# Patient Record
Sex: Male | Born: 1940
Health system: Southern US, Community
[De-identification: ages and names within clinical notes are randomized; demographics above are authoritative.]

## PROBLEM LIST (undated history)

## (undated) DIAGNOSIS — K449 Diaphragmatic hernia without obstruction or gangrene: Secondary | ICD-10-CM

## (undated) DIAGNOSIS — K2210 Ulcer of esophagus without bleeding: Secondary | ICD-10-CM

## (undated) DIAGNOSIS — G20A1 Parkinson's disease without dyskinesia, without mention of fluctuations: Secondary | ICD-10-CM

## (undated) DIAGNOSIS — E7800 Pure hypercholesterolemia, unspecified: Secondary | ICD-10-CM

## (undated) DIAGNOSIS — I2510 Atherosclerotic heart disease of native coronary artery without angina pectoris: Secondary | ICD-10-CM

## (undated) DIAGNOSIS — K648 Other hemorrhoids: Secondary | ICD-10-CM

## (undated) DIAGNOSIS — D126 Benign neoplasm of colon, unspecified: Secondary | ICD-10-CM

## (undated) DIAGNOSIS — Z9689 Presence of other specified functional implants: Secondary | ICD-10-CM

## (undated) DIAGNOSIS — R079 Chest pain, unspecified: Secondary | ICD-10-CM

## (undated) DIAGNOSIS — E119 Type 2 diabetes mellitus without complications: Secondary | ICD-10-CM

## (undated) DIAGNOSIS — I639 Cerebral infarction, unspecified: Secondary | ICD-10-CM

## (undated) DIAGNOSIS — F419 Anxiety disorder, unspecified: Secondary | ICD-10-CM

## (undated) DIAGNOSIS — R51 Headache: Secondary | ICD-10-CM

## (undated) DIAGNOSIS — K222 Esophageal obstruction: Secondary | ICD-10-CM

## (undated) DIAGNOSIS — G20 Parkinson's disease: Secondary | ICD-10-CM

## (undated) HISTORY — DX: Ulcer of esophagus without bleeding: K22.10

## (undated) HISTORY — DX: Other hemorrhoids: K64.8

## (undated) HISTORY — PX: UPPER GASTROINTESTINAL ENDOSCOPY: SHX188

## (undated) HISTORY — PX: HAND SURGERY: SHX662

## (undated) HISTORY — PX: EYE SURGERY: SHX253

## (undated) HISTORY — DX: Esophageal obstruction: K22.2

## (undated) HISTORY — DX: Benign neoplasm of colon, unspecified: D12.6

## (undated) HISTORY — DX: Diaphragmatic hernia without obstruction or gangrene: K44.9

## (undated) HISTORY — PX: DEEP BRAIN STIMULATOR PLACEMENT: SHX608

---

## 1998-11-03 ENCOUNTER — Encounter: Payer: Self-pay | Admitting: Family Medicine

## 1998-11-03 ENCOUNTER — Ambulatory Visit: Admission: RE | Admit: 1998-11-03 | Discharge: 1998-11-03 | Payer: Self-pay | Admitting: Family Medicine

## 1999-01-19 ENCOUNTER — Encounter: Payer: Self-pay | Admitting: Cardiology

## 1999-01-19 ENCOUNTER — Ambulatory Visit (HOSPITAL_COMMUNITY): Admission: RE | Admit: 1999-01-19 | Discharge: 1999-01-19 | Payer: Self-pay | Admitting: Cardiology

## 2003-08-26 ENCOUNTER — Encounter (INDEPENDENT_AMBULATORY_CARE_PROVIDER_SITE_OTHER): Payer: Self-pay | Admitting: *Deleted

## 2003-08-26 ENCOUNTER — Ambulatory Visit (HOSPITAL_BASED_OUTPATIENT_CLINIC_OR_DEPARTMENT_OTHER): Admission: RE | Admit: 2003-08-26 | Discharge: 2003-08-26 | Payer: Self-pay | Admitting: Orthopedic Surgery

## 2003-08-26 ENCOUNTER — Ambulatory Visit (HOSPITAL_COMMUNITY): Admission: RE | Admit: 2003-08-26 | Discharge: 2003-08-26 | Payer: Self-pay | Admitting: Orthopedic Surgery

## 2008-11-18 ENCOUNTER — Encounter (INDEPENDENT_AMBULATORY_CARE_PROVIDER_SITE_OTHER): Payer: Self-pay | Admitting: *Deleted

## 2008-12-14 ENCOUNTER — Ambulatory Visit: Payer: Self-pay | Admitting: Gastroenterology

## 2008-12-20 DIAGNOSIS — D126 Benign neoplasm of colon, unspecified: Secondary | ICD-10-CM

## 2008-12-20 HISTORY — DX: Benign neoplasm of colon, unspecified: D12.6

## 2008-12-29 ENCOUNTER — Encounter: Payer: Self-pay | Admitting: Gastroenterology

## 2008-12-29 ENCOUNTER — Ambulatory Visit: Payer: Self-pay | Admitting: Gastroenterology

## 2008-12-30 ENCOUNTER — Encounter: Payer: Self-pay | Admitting: Gastroenterology

## 2010-01-26 ENCOUNTER — Encounter: Admission: RE | Admit: 2010-01-26 | Discharge: 2010-01-26 | Payer: Self-pay | Admitting: Diagnostic Neuroimaging

## 2010-02-23 ENCOUNTER — Emergency Department (HOSPITAL_BASED_OUTPATIENT_CLINIC_OR_DEPARTMENT_OTHER)
Admission: EM | Admit: 2010-02-23 | Discharge: 2010-02-23 | Payer: Self-pay | Source: Home / Self Care | Admitting: Emergency Medicine

## 2010-08-04 LAB — BASIC METABOLIC PANEL
BUN: 12 mg/dL (ref 6–23)
CO2: 29 mEq/L (ref 19–32)
Calcium: 9.5 mg/dL (ref 8.4–10.5)
Chloride: 105 mEq/L (ref 96–112)
Creatinine, Ser: 0.9 mg/dL (ref 0.4–1.5)
GFR calc Af Amer: >60 mL/min (ref >60)
GFR calc non Af Amer: >60 mL/min (ref >60)
Glucose, Bld: 143 mg/dL — ABNORMAL HIGH (ref 70–99)
Potassium: 3.9 mEq/L (ref 3.5–5.1)
Sodium: 144 mEq/L (ref 135–145)

## 2010-08-04 LAB — DIFFERENTIAL
Basophils Absolute: 0.1 10*3/uL (ref 0.0–0.1)
Lymphocytes Relative: 15 % (ref 12–46)
Lymphs Abs: 1 10*3/uL (ref 0.7–4.0)
Neutro Abs: 5.3 10*3/uL (ref 1.7–7.7)
Neutrophils Relative %: 78 % — ABNORMAL HIGH (ref 43–77)

## 2010-08-04 LAB — CBC
Platelets: 244 10*3/uL (ref 150–400)
RBC: 5.48 MIL/uL (ref 4.22–5.81)
WBC: 6.8 10*3/uL (ref 4.0–10.5)

## 2010-08-19 ENCOUNTER — Observation Stay (HOSPITAL_COMMUNITY)
Admission: EM | Admit: 2010-08-19 | Discharge: 2010-08-20 | Disposition: A | Discharge status: Home / Self Care | Payer: Medicare Other | Attending: Cardiology | Admitting: Cardiology

## 2010-08-19 ENCOUNTER — Emergency Department (HOSPITAL_COMMUNITY): Payer: Medicare Other

## 2010-08-19 DIAGNOSIS — Z79899 Other long term (current) drug therapy: Secondary | ICD-10-CM | POA: Insufficient documentation

## 2010-08-19 DIAGNOSIS — R079 Chest pain, unspecified: Principal | ICD-10-CM | POA: Insufficient documentation

## 2010-08-19 DIAGNOSIS — F411 Generalized anxiety disorder: Secondary | ICD-10-CM | POA: Insufficient documentation

## 2010-08-19 DIAGNOSIS — G20 Parkinson's disease: Secondary | ICD-10-CM | POA: Insufficient documentation

## 2010-08-19 DIAGNOSIS — G20A1 Parkinson's disease without dyskinesia, without mention of fluctuations: Secondary | ICD-10-CM | POA: Insufficient documentation

## 2010-08-19 DIAGNOSIS — E785 Hyperlipidemia, unspecified: Secondary | ICD-10-CM | POA: Insufficient documentation

## 2010-08-19 DIAGNOSIS — E119 Type 2 diabetes mellitus without complications: Secondary | ICD-10-CM | POA: Insufficient documentation

## 2010-08-19 LAB — CBC
HCT: 45.7 % (ref 39.0–52.0)
Hemoglobin: 15.7 g/dL (ref 13.0–17.0)
MCH: 30.1 pg (ref 26.0–34.0)
MCH: 30 pg (ref 26.0–34.0)
MCHC: 35 g/dL (ref 30.0–36.0)
MCV: 86 fL (ref 78.0–100.0)
MCV: 85.6 fL (ref 78.0–100.0)
Platelets: 234 10*3/uL (ref 150–400)
RBC: 5.21 MIL/uL (ref 4.22–5.81)
RDW: 12.8 % (ref 11.5–15.5)
WBC: 8.1 10*3/uL (ref 4.0–10.5)
WBC: 7.1 10*3/uL (ref 4.0–10.5)

## 2010-08-19 LAB — COMPREHENSIVE METABOLIC PANEL
Albumin: 4 g/dL (ref 3.5–5.2)
Alkaline Phosphatase: 117 U/L (ref 39–117)
BUN: 11 mg/dL (ref 6–23)
Creatinine, Ser: 0.95 mg/dL (ref 0.4–1.5)
Glucose, Bld: 132 mg/dL — ABNORMAL HIGH (ref 70–99)
Potassium: 3.7 mEq/L (ref 3.5–5.1)
Total Protein: 6.9 g/dL (ref 6.0–8.3)

## 2010-08-19 LAB — DIFFERENTIAL
Basophils Relative: 0 % (ref 0–1)
Eosinophils Absolute: 0.1 10*3/uL (ref 0.0–0.7)
Eosinophils Relative: 1 % (ref 0–5)
Lymphocytes Relative: 21 % (ref 12–46)
Lymphs Abs: 1.1 10*3/uL (ref 0.7–4.0)
Lymphs Abs: 1.5 10*3/uL (ref 0.7–4.0)
Monocytes Absolute: 0.7 10*3/uL (ref 0.1–1.0)
Monocytes Relative: 6 % (ref 3–12)
Monocytes Relative: 10 % (ref 3–12)
Neutro Abs: 6.5 10*3/uL (ref 1.7–7.7)
Neutrophils Relative %: 80 % — ABNORMAL HIGH (ref 43–77)

## 2010-08-19 LAB — BASIC METABOLIC PANEL
BUN: 12 mg/dL (ref 6–23)
Chloride: 105 mEq/L (ref 96–112)
Creatinine, Ser: 0.99 mg/dL (ref 0.4–1.5)
GFR calc Af Amer: >60 mL/min (ref >60)
GFR calc non Af Amer: >60 mL/min (ref >60)

## 2010-08-19 LAB — URINALYSIS, ROUTINE W REFLEX MICROSCOPIC
Glucose, UA: NEGATIVE mg/dL
Ketones, ur: NEGATIVE mg/dL
Nitrite: NEGATIVE
Protein, ur: NEGATIVE mg/dL
Urobilinogen, UA: 0.2 mg/dL (ref 0.0–1.0)

## 2010-08-19 LAB — POCT CARDIAC MARKERS
CKMB, poc: <1 ng/mL — ABNORMAL LOW (ref 1.0–8.0)
Troponin i, poc: <0.05 ng/mL (ref 0.00–0.09)
Troponin i, poc: <0.05 ng/mL (ref 0.00–0.09)

## 2010-08-19 LAB — PROTIME-INR
INR: 1.05 (ref 0.00–1.49)
Prothrombin Time: 13.9 seconds (ref 11.6–15.2)

## 2010-08-19 LAB — APTT: aPTT: 28 seconds (ref 24–37)

## 2010-08-19 LAB — CARDIAC PANEL(CRET KIN+CKTOT+MB+TROPI): Troponin I: 0.01 ng/mL (ref 0.00–0.06)

## 2010-08-19 LAB — HEMOGLOBIN A1C
Hgb A1c MFr Bld: 7.5 % — ABNORMAL HIGH (ref <5.7)
Mean Plasma Glucose: 169 mg/dL — ABNORMAL HIGH (ref <117)

## 2010-08-20 LAB — CARDIAC PANEL(CRET KIN+CKTOT+MB+TROPI)
CK, MB: 2.5 ng/mL (ref 0.3–4.0)
Total CK: 165 U/L (ref 7–232)
Troponin I: 0.01 ng/mL (ref 0.00–0.06)

## 2010-08-20 LAB — GLUCOSE, CAPILLARY: Glucose-Capillary: 202 mg/dL — ABNORMAL HIGH (ref 70–99)

## 2010-08-20 LAB — LIPID PANEL
HDL: 31 mg/dL — ABNORMAL LOW (ref >39)
Triglycerides: 158 mg/dL — ABNORMAL HIGH (ref <150)
VLDL: 32 mg/dL (ref 0–40)

## 2010-08-22 NOTE — Discharge Summary (Signed)
  NAME:  Kirk Rosario, Kirk Rosario NO.:  [id number]  MEDICAL RECORD NO.:  [id number]           PATIENT TYPE:  O  LOCATION:  1440                         FACILITY:  [facility]  PHYSICIAN:  Landry Corporal, MD DATE OF BIRTH:  February 22, 1941  DATE OF ADMISSION:  08/19/2010 DATE OF DISCHARGE:  08/20/2010                              DISCHARGE SUMMARY   DISCHARGE DIAGNOSES: 1. Chest pain, myocardial infarction, ruled out. 2. History of 2 previous normal catheterizations 10 years ago. 3. Type 2 non-insulin-dependent diabetes. 4. Parkinson disease. 5. Treated dyslipidemia. 6. Anxiety disorder.  HOSPITAL COURSE:  The patient is a 70 year old male who had seen Dr. Clarene Duke remotely.  He had had 2 previous catheterizations in the 1990s and early 2000s.  These were reportedly negative.  He has not seen Dr. Clarene Duke for years.  He presented emergency room at [facility] on August 19, 2010, after having some epigastric pain associated with sharp shooting pain up his left arm.  This came on after he was moaning in his yard.  His EKG and troponins were normal.  He was admitted for overnight observation.  His enzymes continued to be normal and Dr. Herbie Baltimore feels he can be discharged with plan for an outpatient Myoview this week.  We did add PPI, low-dose beta-blocker, and p.r.n. nitroglycerin.  LABORATORY DATA:  Stool for occult blood was negative.  Cholesterol is 129, HDL 31, LDL 66.  CK-MB and troponins were negative x4.  Sodium 139, potassium 3.7, BUN 11, creatinine 0.95.  Liver functions were normal. Hemoglobin A1c is 7.5, mean plasma glucose 169.  TSH 1.66.  Urinalysis is unremarkable.  White count 7.1, hemoglobin 16, hematocrit 45.7, platelets 234.  IMAGING:  Chest x-ray shows hyperexpansion and bibasilar atelectasis without acute findings.  EKG shows sinus rhythm without acute changes.  DISPOSITION:  The patient is discharged in stable condition.  He will follow up with Dr. Herbie Baltimore as  an outpatient.  He will be set up for a Persantine Myoview in our office this coming week.     Abelino Derrick, P.A.   ______________________________ Landry Corporal, MD    LKK/MEDQ  D:  08/20/2010  T:  08/20/2010  Job:  [id number]  cc:   Landry Corporal, MD Fax: [phone]  Anna Genre. Little, M.D. Fax: [phone]  Electronically Signed by Corine Shelter P.A. on 08/22/2010 11:39:54 AM Electronically Signed by Bryan Lemma MD on 08/22/2010 10:53:07 PM

## 2010-08-22 NOTE — H&P (Signed)
NAME:  Kirk Rosario, FAHR NO.:  [id number]  MEDICAL RECORD NO.:  [id number]           PATIENT TYPE:  O  LOCATION:  1440                         FACILITY:  [facility]  PHYSICIAN:  Landry Corporal, MD DATE OF BIRTH:  07-09-1940  DATE OF ADMISSION:  08/19/2010 DATE OF DISCHARGE:                             HISTORY & PHYSICAL   CHIEF COMPLAINT:  Chest pain.  HISTORY OF PRESENT ILLNESS:  Kirk Rosario is a pleasant 70 year old male who have been seen by Dr. Clarene Duke remotely.  He apparently had catheterization in the 1990s and in early 2000, both of which were negative for significant coronary artery disease per the patient's history.  These records are not available to me.  He has not seen Dr. Clarene Duke since then.  He has not had other cardiac issues.  He does have Parkinson's, and is followed by a neurologist at [facility].  The patient is admitted to the emergency room at [facility] today.  He went to mow his grass today and said he rested about halfway through.  He then developed some epigastric discomfort and a sharp pain that shot up his left arm.  He decided to come to the emergency room for further evaluation.  His initial troponin is negative.  His EKG is normal.  He tells me that when he walks exerts himself, he does not have chest pain or unusual shortness of breath.  Interestingly, he does say he gets some chest discomfort and shortness of breath after taking ropinirole which he takes 3 times a day for Parkinson's.  He says his neurologist knows about this and apparently is not concerned.  This has been going on since December.  PAST MEDICAL HISTORY:  Remarkable for: 1. Parkinson's. 2. Type 2 noninsulin-dependent diabetes. 3. Dyslipidemia. 4. Prior Dupuytren contracture surgery in 2005.  HOME MEDICATIONS: 1. Metformin 500 mg daily. 2. Pravastatin 20 mg a day. 3. Paxil 20 mg a day. 4. Ropinirole 3 mg t.i.d. 5. Ativan 1 mg q.8 p.r.n.  He has no known drug  allergies.  SOCIAL HISTORY:  He is a retired Engineer, [employer].  He is a nonsmoker, nondrinker.  He is married, he has three daughters.  FAMILY HISTORY:  Unremarkable for coronary artery disease.  REVIEW OF SYSTEMS:  He has had a remote hiatal hernia.  He does wear glasses.  Review of systems otherwise unremarkable except for noted above.  PHYSICAL EXAMINATION:  VITAL SIGNS:  Blood pressure in the emergency room 139/58, pulse 73, temperature 98.1. GENERAL:  He is a well-developed, well-nourished male in no acute distress. HEENT:  Normocephalic.  Extraocular movements are intact.  Sclerae are nonicteric.  Conjunctivae within normal limits. NECK:  Without bruits or JVD. CHEST:  Clear to auscultation and percussion. CARDIAC:  Regular rate and rhythm without murmur, rub, or gallop. Normal S1 and S2. ABDOMEN:  Nontender.  No hepatosplenomegaly.  Bowel sounds present. EXTREMITIES:  Without edema.  Distal pulses are 3+/4. NEUROLOGIC:  Grossly intact.  He does have Parkinsonian movements of his arms and legs.  SKIN:  Cool and dry.  LABORATORY DATA:  Sodium 138, potassium 4.2, chloride 105, CO2  of 27, BUN 12, creatinine 0.99.  White count 8.1, hemoglobin 15.7, hematocrit 44.8, platelets 224.  Urinalysis unremarkable.  Troponin is negative x1. EKG shows sinus rhythm without acute changes.  Chest x-ray shows hyperexpansion and some bibasilar atelectasis without acute findings.  IMPRESSION: 1. Chest pain, rule out cardiac. 2. Prior normal coronaries in the 1990s and early 2000 by the     patient's history. 3. Type 2 noninsulin-dependent diabetes. 4. Dyslipidemia. 5. Parkinson's, followed at [facility].  PLAN:  The patient will be admitted to telemetry for 24-hour observation.  I will go ahead and put him on Lovenox, aspirin, low-dose metoprolol, and rule him out for an MI.  If he rules out, he will be set up for an outpatient Myoview in the office.  The patient was seen by Dr. Herbie Baltimore and  myself.     Abelino Derrick, P.A.   ______________________________ Landry Corporal, MD    LKK/MEDQ  D:  08/19/2010  T:  08/20/2010  Job:  [id number]  cc:   Caryn Bee L. Little, M.D. Thereasa Solo. Little, M.D.  Electronically Signed by Corine Shelter P.A. on 08/22/2010 11:39:48 AM Electronically Signed by Bryan Lemma MD on 08/22/2010 10:52:36 PM MedRecNo: [id number] [facility], Account: [id number], DocSeq: [id number] I saw and examined the patient in the [facility] ER along with Mr. Kirk Rosario after reviewing the chart.  I agree with his findings, exam along with his impression and plan.  See paper chart for my handwritten attestation. Electronically Signed by Bryan Lemma MD on 08/22/2010 10:52:16 PM

## 2010-10-07 NOTE — Op Note (Signed)
NAME:  Kirk Rosario, Kirk Rosario                        ACCOUNT NO.:  [id number]   MEDICAL RECORD NO.:  [id number]                   PATIENT TYPE:  AMB   LOCATION:  DSC                                  FACILITY:  [facility]   PHYSICIAN:  Artist Pais. Mina Marble, M.D.           DATE OF BIRTH:  22-Mar-1941   DATE OF PROCEDURE:  DATE OF DISCHARGE:                                 OPERATIVE REPORT   PREOPERATIVE DIAGNOSIS:  Left hand fourth and fifth digit Dupuytren's  contracture.   POSTOPERATIVE DIAGNOSIS:  Left hand fourth and fifth digit Dupuytren's  contracture.   PROCEDURE:  Release of Dupuytren's contracture, palmar aspect of left hand,  fourth and fifth digits.   SURGEON:  Artist Pais. Mina Marble, M.D.   ASSISTANT:  __________   ANESTHESIA:  General.   TOURNIQUET TIME:  One hour.   COMPLICATIONS:  None.   DRAINS:  None.   OPERATIVE REPORT:  Patient was taken to the operating room.  With the  induction of general anesthesia, the left upper extremity was prepped and  draped in the usual, sterile fashion.  An Esmarch was used to exsanguinate  the limb.  Tourniquet was inflated to 275 mmHg.  At this point in time, a  Brunner-type  incision was made over the ulnar side of the hand going up to  the level of the DIP flexion crease on the little finger.  The flaps were  carefully raised accordingly.  The neurovascular bundles were identified in  the proximal aspect of the wound.  A large ulnar-based cord was identified.  The ulnar neurovascular bundle was taken to the midline at the level of the  metacarpophalangeal joint.  Neurovascular bundles were carefully dissected  out and retracted.  Once this was done, the remaining aspect of the  contracture was excised to the level of the PIP joint.  Once this was done,  the MP and PIP joints were gently manipulated and a small amount of  contracted tissue in the PIP joint was released with a #15 blade in a  __________ fashion.  Once this was done, a  second incision was made parallel  in the first incision in the palmar aspect overlying the fourth metacarpal.  Incision was taken down through the skin and subcutaneous tissues, flaps  raised accordingly, and diseased cord was removed also.  MP joint was gently  manipulated.  Both wounds were thoroughly irrigated.  Hemostasis was  achieved with bipolar cautery and they were closed with 5-0 nylon.  A #5  pediatric feeding tube was placed in the wounds.  Aristospan was placed in  the wound for postoperative pain and swelling control.  The patient, after  the wound closure of 5-0 nylon, was then placed in sterile dressing with  Xeroform, 4 x 4's, fluffs, and a volar splint with the fingers in maximum  extension.  Patient tolerated the procedure well and went to recovery room  in stable condition.  Artist Pais Mina Marble, M.D.   MAW/MEDQ  D:  08/26/2003  T:  08/26/2003  Job:  [id number]

## 2011-05-02 ENCOUNTER — Other Ambulatory Visit: Payer: Self-pay

## 2011-05-02 ENCOUNTER — Emergency Department (HOSPITAL_COMMUNITY): Payer: Medicare Other

## 2011-05-02 ENCOUNTER — Encounter: Payer: Self-pay | Admitting: *Deleted

## 2011-05-02 ENCOUNTER — Emergency Department (HOSPITAL_COMMUNITY)
Admission: EM | Admit: 2011-05-02 | Discharge: 2011-05-02 | Disposition: A | Discharge status: Home / Self Care | Payer: Medicare Other | Attending: Emergency Medicine | Admitting: Emergency Medicine

## 2011-05-02 DIAGNOSIS — R0602 Shortness of breath: Secondary | ICD-10-CM | POA: Insufficient documentation

## 2011-05-02 DIAGNOSIS — Z79899 Other long term (current) drug therapy: Secondary | ICD-10-CM | POA: Insufficient documentation

## 2011-05-02 DIAGNOSIS — G20A1 Parkinson's disease without dyskinesia, without mention of fluctuations: Secondary | ICD-10-CM | POA: Insufficient documentation

## 2011-05-02 DIAGNOSIS — I10 Essential (primary) hypertension: Secondary | ICD-10-CM | POA: Insufficient documentation

## 2011-05-02 DIAGNOSIS — R079 Chest pain, unspecified: Secondary | ICD-10-CM | POA: Insufficient documentation

## 2011-05-02 DIAGNOSIS — R42 Dizziness and giddiness: Secondary | ICD-10-CM | POA: Insufficient documentation

## 2011-05-02 DIAGNOSIS — E119 Type 2 diabetes mellitus without complications: Secondary | ICD-10-CM | POA: Insufficient documentation

## 2011-05-02 DIAGNOSIS — G20 Parkinson's disease: Secondary | ICD-10-CM | POA: Insufficient documentation

## 2011-05-02 DIAGNOSIS — M79609 Pain in unspecified limb: Secondary | ICD-10-CM | POA: Insufficient documentation

## 2011-05-02 HISTORY — DX: Parkinson's disease without dyskinesia, without mention of fluctuations: G20.A1

## 2011-05-02 HISTORY — DX: Pure hypercholesterolemia, unspecified: E78.00

## 2011-05-02 HISTORY — DX: Parkinson's disease: G20

## 2011-05-02 LAB — POCT I-STAT, CHEM 8
Creatinine, Ser: 1 mg/dL (ref 0.50–1.35)
HCT: 49 % (ref 39.0–52.0)
Hemoglobin: 16.7 g/dL (ref 13.0–17.0)
Potassium: 4.1 mEq/L (ref 3.5–5.1)
Sodium: 141 mEq/L (ref 135–145)
TCO2: 24 mmol/L (ref 0–100)

## 2011-05-02 LAB — DIFFERENTIAL
Basophils Absolute: 0 10*3/uL (ref 0.0–0.1)
Basophils Relative: 0 % (ref 0–1)
Eosinophils Absolute: 0.2 10*3/uL (ref 0.0–0.7)
Monocytes Relative: 6 % (ref 3–12)
Neutro Abs: 5.1 10*3/uL (ref 1.7–7.7)
Neutrophils Relative %: 71 % (ref 43–77)

## 2011-05-02 LAB — TROPONIN I: Troponin I: <0.3 ng/mL (ref <0.30)

## 2011-05-02 LAB — CBC
MCH: 30.3 pg (ref 26.0–34.0)
MCHC: 35.9 g/dL (ref 30.0–36.0)
Platelets: 207 10*3/uL (ref 150–400)
RDW: 12.5 % (ref 11.5–15.5)

## 2011-05-02 NOTE — ED Notes (Signed)
Pt c/o L arm pain and lightheadedness this am. Reports mid chest pain and L arm tingling last night. Denies shob, n/v. Hx of parkinsons.

## 2011-05-02 NOTE — ED Notes (Signed)
Patient is very anxious will inform Dr Rubin Payor

## 2011-05-02 NOTE — ED Notes (Signed)
States that he had CP all day yesterday. States that he took an antiacid and the pain went away. States that he remains light headed this am. States that he has been monitoring his BP at home and has not been dx with HTN

## 2011-05-02 NOTE — ED Notes (Signed)
No complaints at present. Voices understanding of instructions given. Walked to check out window.  

## 2011-05-02 NOTE — ED Notes (Signed)
Returned from [employer] much calmer.

## 2011-05-02 NOTE — ED Provider Notes (Signed)
History     CSN: [id number] Arrival date & time: 05/02/2011  8:39 AM   First MD Initiated Contact with Patient 05/02/11 [phone]      Chief Complaint  Patient presents with  . Arm Pain  . Dizziness    (Consider location/radiation/quality/duration/timing/severity/associated sxs/prior treatment) Patient is a 70 y.o. male presenting with arm pain. The history is provided by the patient.  Arm Pain This is a recurrent problem. Associated symptoms include chest pain. Pertinent negatives include no abdominal pain, no headaches and no shortness of breath.   patient developed some left arm tingling this morning. He's also had some sharp chest pain last night. He states that was relieved with his antacid. He no cough. No fevers. He also states that he had some lightheadedness this morning. He's also felt fatigued. He states it makes it is just his Parkinson's. He states he still has a little bit of the tingling now. He states the chest pain is resolved.   Past Medical History  Diagnosis Date  . Diabetes mellitus   . Hypertension   . Parkinson disease   . Hypercholesteremia     Past Surgical History  Procedure Date  . Hand surgery     No family history on file.  History  Substance Use Topics  . Smoking status: Never Smoker   . Smokeless tobacco: Not on file  . Alcohol Use: No      Review of Systems  Constitutional: Negative for activity change and appetite change.  HENT: Negative for neck stiffness.   Eyes: Negative for pain.  Respiratory: Negative for chest tightness and shortness of breath.   Cardiovascular: Positive for chest pain. Negative for leg swelling.  Gastrointestinal: Negative for nausea, vomiting, abdominal pain and diarrhea.  Genitourinary: Negative for flank pain.  Musculoskeletal: Negative for back pain.  Skin: Negative for rash.  Neurological: Positive for light-headedness. Negative for weakness, numbness and headaches.  Psychiatric/Behavioral: Negative for  behavioral problems.    Allergies  Review of patient's allergies indicates no known allergies.  Home Medications   Current Outpatient Rx  Name Route Sig Dispense Refill  . ASPIRIN EC 325 MG PO TBEC Oral Take 325 mg by mouth daily as needed. For pain     . CARBIDOPA-LEVODOPA 25-100 MG PO TABS Oral Take 1 tablet by mouth 3 (three) times daily.      Marland Kitchen METFORMIN HCL 500 MG PO TABS Oral Take 500 mg by mouth daily.      Marland Kitchen PAROXETINE HCL 20 MG PO TABS Oral Take 20 mg by mouth daily.      Marland Kitchen PRAVASTATIN SODIUM 40 MG PO TABS Oral Take 40 mg by mouth every evening.      Marland Kitchen ROPINIROLE HCL 0.5 MG PO TABS Oral Take 0.5 mg by mouth 3 (three) times daily.      . TRIHEXYPHENIDYL HCL 2 MG PO TABS Oral Take 2 mg by mouth 2 (two) times daily.        BP 157/65  Pulse 66  Temp(Src) 97.7 F (36.5 C) (Oral)  Resp 16  SpO2 100%  Physical Exam  Nursing note and vitals reviewed. Constitutional: He is oriented to person, place, and time. He appears well-developed and well-nourished.  HENT:  Head: Normocephalic and atraumatic.  Eyes: EOM are normal. Pupils are equal, round, and reactive to light.  Neck: Normal range of motion. Neck supple.  Cardiovascular: Normal rate, regular rhythm and normal heart sounds.   No murmur heard. Pulmonary/Chest: Effort normal and breath sounds  normal.  Abdominal: Soft. Bowel sounds are normal. He exhibits no distension and no mass. There is no tenderness. There is no rebound and no guarding.  Musculoskeletal: Normal range of motion. He exhibits no edema.  Neurological: He is alert and oriented to person, place, and time. No cranial nerve deficit.       Patient has a tremor  Skin: Skin is warm and dry.  Psychiatric: He has a normal mood and affect.    ED Course  Procedures (including critical care time)  Labs Reviewed  POCT I-STAT, CHEM 8 - Abnormal; Notable for the following:    Glucose, Bld 168 (*)    All other components within normal limits  CBC  DIFFERENTIAL    TROPONIN I  I-STAT, CHEM 8   Dg Chest 2 View  05/02/2011  *RADIOLOGY REPORT*  Clinical Data: Chest pain and shortness of breath.  Nonsmoker.  CHEST - 2 VIEW  Comparison: 08/19/2010  Findings: Thoracic spondylosis/diffuse idiopathic skeletal hyperostosis.  Costophrenic angles minimally excluded from the lateral. Midline trachea.  Normal heart size and mediastinal contours for age.  No pleural effusion or pneumothorax.  Right upper lobe calcified granuloma versus soft tissue calcification. Lungs otherwise clear.  IMPRESSION: No acute cardiopulmonary disease.  Original Report Authenticated By: Consuello Bossier, M.D.     1. Chest pain      Date: 05/02/2011  Rate: 66  Rhythm: normal sinus rhythm  QRS Axis: normal  Intervals: normal  ST/T Wave abnormalities: normal  Conduction Disutrbances:none  Narrative Interpretation:   Old EKG Reviewed: unchanged    MDM  Patient presents with chest pain and tingling in his left arm. He thinks it may be related to his Parkinson's medicine. He is previously had 2 negative caths and recently a negative stress test. His EKG is stable enzymes are negative. His laboratory x-ray is reassuring. He'll be discharged home. He'll followup with his cardiologist.        Juliet Rude. Rubin Payor, MD 05/02/11 1037

## 2011-06-07 ENCOUNTER — Other Ambulatory Visit: Payer: Self-pay

## 2011-06-07 ENCOUNTER — Encounter (HOSPITAL_COMMUNITY): Payer: Self-pay

## 2011-06-07 ENCOUNTER — Emergency Department (HOSPITAL_COMMUNITY): Payer: Medicare Other

## 2011-06-07 ENCOUNTER — Emergency Department (HOSPITAL_COMMUNITY)
Admission: EM | Admit: 2011-06-07 | Discharge: 2011-06-07 | Disposition: A | Discharge status: Home / Self Care | Payer: Medicare Other | Attending: Cardiology | Admitting: Cardiology

## 2011-06-07 DIAGNOSIS — G030 Nonpyogenic meningitis: Secondary | ICD-10-CM | POA: Insufficient documentation

## 2011-06-07 DIAGNOSIS — R079 Chest pain, unspecified: Secondary | ICD-10-CM

## 2011-06-07 DIAGNOSIS — K449 Diaphragmatic hernia without obstruction or gangrene: Secondary | ICD-10-CM | POA: Insufficient documentation

## 2011-06-07 DIAGNOSIS — Z7982 Long term (current) use of aspirin: Secondary | ICD-10-CM | POA: Insufficient documentation

## 2011-06-07 DIAGNOSIS — Z79899 Other long term (current) drug therapy: Secondary | ICD-10-CM | POA: Insufficient documentation

## 2011-06-07 DIAGNOSIS — E119 Type 2 diabetes mellitus without complications: Secondary | ICD-10-CM

## 2011-06-07 HISTORY — DX: Chest pain, unspecified: R07.9

## 2011-06-07 HISTORY — DX: Diaphragmatic hernia without obstruction or gangrene: K44.9

## 2011-06-07 HISTORY — DX: Type 2 diabetes mellitus without complications: E11.9

## 2011-06-07 LAB — CBC
Hemoglobin: 16.3 g/dL (ref 13.0–17.0)
MCH: 29.8 pg (ref 26.0–34.0)
Platelets: 218 10*3/uL (ref 150–400)
RBC: 5.47 MIL/uL (ref 4.22–5.81)

## 2011-06-07 LAB — TROPONIN I
Troponin I: <0.3 ng/mL (ref <0.30)
Troponin I: <0.3 ng/mL (ref <0.30)

## 2011-06-07 LAB — BASIC METABOLIC PANEL
CO2: 27 mEq/L (ref 19–32)
Calcium: 9.6 mg/dL (ref 8.4–10.5)
GFR calc non Af Amer: 73 mL/min — ABNORMAL LOW (ref >90)
Potassium: 4.1 mEq/L (ref 3.5–5.1)
Sodium: 138 mEq/L (ref 135–145)

## 2011-06-07 LAB — D-DIMER, QUANTITATIVE: D-Dimer, Quant: <0.22 ug/mL-FEU (ref 0.00–0.48)

## 2011-06-07 NOTE — Consult Note (Signed)
Reason for Consult: chest pain Primary physician: Dr. Catha Gosselin Cardiologist: Dr. Bryan Lemma  Referring Physician: ER physician   Kirk Rosario is an 71 y.o. male.    Chief Complaint: chest pain that began today   HPI: 71 year old white married male presented to the emergency room after developing chest pain approximately 11 AM today while watching TV. He describes it as sharp pain which began in the diaphragmatic area and radiated upward into his chest where it localized in the significant pain rated 12/10. The pain would come and go. No associated symptoms of nausea vomiting, shortness of breath or diaphoresis but the patient's wife states he was very pale. He does not usually complain of any problems so this was unusual for him. This was his second or third episode of this type of chest pain he was seen here in December with similar pain.  This pain was similar to that in the past when a cardiac cath was nl and stress test was nl.  Patient has had a cardiac catheterization in 1995 and again in 1999 with patent coronary arteries. He was seen by Dr. Herbie Baltimore and underwent a stress Myoview in April of 2012 which was negative for ischemia. EF was 64%.  Patient's other history as stated below as well as anxiety.  Currently on our exam the patient is totally pain-free. Dr. Tresa Endo has seen him and examined him and does not believe this to be of cardiac origin. His  troponin I x2 are negative.    From a cardiology perspective the patient may be discharged home and followup in our office 06/15/2011 at 10:15 AM  Past Medical History  Diagnosis Date  . Diabetes mellitus   . Parkinson disease   . Hypercholesteremia   . Chest pain at rest 06/07/2011  . DM (diabetes mellitus) 06/07/2011  . Hernia, hiatal     Past Surgical History  Procedure Date  . Hand surgery     Family History  Problem Relation Age of Onset  . Heart failure Mother   . Cancer Father    Social History:  reports  that he quit smoking about 45 years ago. He has never used smokeless tobacco. He reports that he does not drink alcohol or use illicit drugs. 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  Allergies: No Known Allergies  No current facility-administered medications on file as of 06/07/2011.   Medications Prior to Admission  Medication Sig Dispense Refill  . aspirin EC 325 MG tablet Take 325 mg by mouth daily as needed. For pain       . carbidopa-levodopa (SINEMET) 25-100 MG per tablet Take 1 tablet by mouth 3 (three) times daily.        . metFORMIN (GLUCOPHAGE) 500 MG tablet Take 500 mg by mouth daily.        Marland Kitchen PARoxetine (PAXIL) 20 MG tablet Take 20 mg by mouth daily.        . pravastatin (PRAVACHOL) 40 MG tablet Take 40 mg by mouth every evening.        Marland Kitchen rOPINIRole (REQUIP) 0.5 MG tablet Take 0.25 mg by mouth 3 (three) times daily.       . trihexyphenidyl (ARTANE) 2 MG tablet Take 2 mg by mouth 2 (two) times daily.          Results for orders placed during the hospital encounter of 06/07/11 (from the past 48 hour(s))  CBC     Status: Normal  Collection Time   06/07/11  1:10 PM      Component Value Range Comment   WBC 6.8  4.0 - 10.5 (K/uL)    RBC 5.47  4.22 - 5.81 (MIL/uL)    Hemoglobin 16.3  13.0 - 17.0 (g/dL)    HCT 40.9  81.1 - 91.4 (%)    MCV 83.7  78.0 - 100.0 (fL)    MCH 29.8  26.0 - 34.0 (pg)    MCHC 35.6  30.0 - 36.0 (g/dL)    RDW 78.2  95.6 - 21.3 (%)    Platelets 218  150 - 400 (K/uL)   BASIC METABOLIC PANEL     Status: Abnormal   Collection Time   06/07/11  1:10 PM      Component Value Range Comment   Sodium 138  135 - 145 (mEq/L)    Potassium 4.1  3.5 - 5.1 (mEq/L)    Chloride 99  96 - 112 (mEq/L)    CO2 27  19 - 32 (mEq/L)    Glucose, Bld 258 (*) 70 - 99 (mg/dL)    BUN 12  6 - 23 (mg/dL)    Creatinine, Ser 0.86  0.50 - 1.35 (mg/dL)    Calcium 9.6  8.4 - 10.5 (mg/dL)    GFR calc non Af Amer 73 (*) >90 (mL/min)    GFR calc Af Amer 84 (*)  >90 (mL/min)   TROPONIN I     Status: Normal   Collection Time   06/07/11  1:10 PM      Component Value Range Comment   Troponin I <0.30  <0.30 (ng/mL)    Dg Chest 2 View  06/07/2011  *RADIOLOGY REPORT*  Clinical Data: Mid chest pain, history diabetes  CHEST - 2 VIEW  Comparison: 05/02/2011  Findings: Normal heart size, mediastinal contours, and pulmonary vascularity. Probable bilateral nipple shadows, unchanged since 08/19/2010. Radiopaque foreign body projects over right upper lobe. Lungs clear. No pleural effusion or pneumothorax. Bones unremarkable. Minimal linear scarring left base stable.  IMPRESSION: No acute abnormalities.  Original Report Authenticated By: Lollie Marrow, M.D.    ROSDicky Doe.: No colds or fevers. Skin: No rashes or ulcers. HEENT: No blurred vision or double vision. Cardiovascular: No awareness of palpitations. No chest pain with activity though he walks 5 miles a day and rides his bike 18 miles a day and that is a stationary bicycle.  Pulmonary: Denies shortness of breath or wheezing. GI: States he does have a hiatal hernia this pain is similar to the pain he had when he had his heart catheterization. No diarrhea constipation or melena. GU: No hematuria or dysuria. Musculoskeletal: no claudication symptoms no arthritic pains. Endocrine: positive for diabetes  And treated.  But no thyroid disease. Neuro: On felt as if he would pass out today the pain was so severe. Patient has a history of Parkinson's with tremors most significantly in his right leg treated at [facility]   Blood pressure 157/69, pulse 74, temperature 98.1 F (36.7 C), temperature source Oral, resp. rate 19, SpO2 100.00%. PE: Gen.: Alert, oriented, white male, no acute distress, pleasant affect.  Skin: Warm and dry brisk artery refill. HEENT: Normocephalic, sclera clear. Neck: Supple no JVD no bruits no thyromegaly. Lungs: Clear without rales rhonchi or wheezes Heart: S1-S2 regular rate and  rhythm without murmur gallop rub or click. Abdomen: Soft nontender positive bowel sounds to palpate liver spleen or masses.  Small ventral hernia vs diastasis recti in mid epigastric region. Extremities  no lower extremity edema 2+ pedal pulses bilaterally. Tremor of the right lower Sterman in on exam. Neuro: Alert and oriented x3 moves all extremities follows commands.  Assessment/Plan Patient Active Problem List  Diagnoses  . Chest pain at rest , negative troponins, patent coronary arteries 1999 and neg. stress myoview 08/2010  . DM (diabetes mellitus)   PLAN:data telemetry has seen and examined the patient. The patient has no further complaints no chest pain he did take aspirin prior to his arrival.  On the monitor he does have an occasional PVC and PAC. His EKG was without acute changes from prior EKGs.  Dr. Landry Dyke discussed that he does not believe this is cardiac in origin and that he should followup with his primary care physician, Dr. Catha Gosselin for GI evaluation and we will also have him see Dr. Herbie Baltimore for followup.  INGOLD,LAURA R 06/07/2011, 4:32 PM    Patient seen and examined. Agree with assessment and plan. Pt is now currently pain free.  He ha undergone 2 prior cardiac catheterizations which were nl.  Last year after experiencing similar chest pain, a nuclear scan was nl.  He denies exertional precipitation, dypnea or reflux symptoms.  Initial cardiac markers are negative.  Will check d-dimer and if negative plan dc today with f/u with PCP and Dr. Herbie Baltimore.  Lennette Bihari, MD, [facility] 06/07/2011 4:53 PM

## 2011-06-07 NOTE — ED Provider Notes (Signed)
History     CSN: [id number]  Arrival date & time 06/07/11  1245   First MD Initiated Contact with Patient 06/07/11 1259      No chief complaint on file.   (Consider location/radiation/quality/duration/timing/severity/associated sxs/prior treatment) HPI  Cardiologist is Dr. Herbie Baltimore with [facility]. Pt was watching jeopardy this morning around 11:30 am when he developed severe left sided chest pain with presyncope. Pt did not LOC. He states that it was a sharp pain and lasted a couple of hours. He took an aspirin and he and his wife came to the ED. Since arriving to the ED he has been pain free. He denies a hx of GERD, N/V/D abdominal pain, syncope, recent illness, coughing, chills, fever. Last cath was in 2000.  Past Medical History  Diagnosis Date  . Diabetes mellitus   . Parkinson disease   . Hypercholesteremia   . Chest pain at rest 06/07/2011  . DM (diabetes mellitus) 06/07/2011  . Hernia, hiatal     Past Surgical History  Procedure Date  . Hand surgery     Family History  Problem Relation Age of Onset  . Heart failure Mother   . Cancer Father     History  Substance Use Topics  . Smoking status: Former Smoker    Quit date: 05/22/1966  . Smokeless tobacco: Never Used  . Alcohol Use: No      Review of Systems  All other systems reviewed and are negative.    Allergies  Review of patient's allergies indicates no known allergies.  Home Medications   Current Outpatient Rx  Name Route Sig Dispense Refill  . ASPIRIN EC 325 MG PO TBEC Oral Take 325 mg by mouth daily as needed. For pain     . CARBIDOPA-LEVODOPA 25-100 MG PO TABS Oral Take 1 tablet by mouth 3 (three) times daily.      Marland Kitchen METFORMIN HCL 500 MG PO TABS Oral Take 500 mg by mouth daily.      Marland Kitchen PAROXETINE HCL 20 MG PO TABS Oral Take 20 mg by mouth daily.      Marland Kitchen PRAVASTATIN SODIUM 40 MG PO TABS Oral Take 40 mg by mouth every evening.      Marland Kitchen ROPINIROLE HCL 0.5 MG PO TABS Oral Take 0.25 mg by mouth 3 (three)  times daily.     . TRIHEXYPHENIDYL HCL 2 MG PO TABS Oral Take 2 mg by mouth 2 (two) times daily.        BP 157/69  Pulse 74  Temp(Src) 98.1 F (36.7 C) (Oral)  Resp 19  SpO2 100%  Physical Exam  Nursing note and vitals reviewed. Constitutional: He is oriented to person, place, and time. He appears well-developed and well-nourished.  HENT:  Head: Normocephalic and atraumatic.  Eyes: EOM are normal. Pupils are equal, round, and reactive to light.  Neck: Normal range of motion.  Cardiovascular: Normal rate and regular rhythm.   Pulmonary/Chest: Effort normal and breath sounds normal.  Musculoskeletal: Normal range of motion.  Neurological: He is alert and oriented to person, place, and time.  Skin: Skin is warm and dry.    ED Course  Procedures (including critical care time)  Labs Reviewed  BASIC METABOLIC PANEL - Abnormal; Notable for the following:    Glucose, Bld 258 (*)    GFR calc non Af Amer 73 (*)    GFR calc Af Amer 84 (*)    All other components within normal limits  CBC  TROPONIN I  TROPONIN  I  D-DIMER, QUANTITATIVE   Dg Chest 2 View  06/07/2011  *RADIOLOGY REPORT*  Clinical Data: Mid chest pain, history diabetes  CHEST - 2 VIEW  Comparison: 05/02/2011  Findings: Normal heart size, mediastinal contours, and pulmonary vascularity. Probable bilateral nipple shadows, unchanged since 08/19/2010. Radiopaque foreign body projects over right upper lobe. Lungs clear. No pleural effusion or pneumothorax. Bones unremarkable. Minimal linear scarring left base stable.  IMPRESSION: No acute abnormalities.  Original Report Authenticated By: Lollie Marrow, M.D.     1. Chest pain at rest       MDM    Date: 06/07/2011  Rate: 78   Rhythm: normal sinus rhythm  QRS Axis: normal  Intervals: normal  ST/T Wave abnormalities: normal  Conduction Disutrbances:none  Narrative Interpretation:   Old EKG Reviewed: unchanged May 02, 2011    I have spoken with the NP and [facility]  and they have agreed to come evaluate pt in ED.  Nada Boozer (NP or MD?) saw patient from [facility] and states that if D-dimer is negative pt can be D/C. If positive to call Dr. Allyson Sabal. D-dimer negative. Pt remains pain free. Will D/C pt.      Dorthula Matas, PA 06/07/11 1912

## 2011-06-07 NOTE — ED Notes (Addendum)
Pt reports dizziness, and chest pain 1010, sharp and dull at times, no sob, nausea, vomiting. no radiation of pain. ABC intact, breath sound clear. Took 325 mg of aspirin prior to arrival. Denied previous cardiac hx

## 2011-06-07 NOTE — ED Notes (Signed)
Pt c/o chest pain, 10/10, sharp, x 2 days, on and off, worsen with breathing, denied coughing. Reports hx of chest pain before. No sob. HR 91, sat 100%. R 14/.

## 2011-06-07 NOTE — ED Notes (Signed)
D/c instructions reviewed w/ pt and family - pt and family deny any further questions or concerns at present.\ 

## 2011-06-08 NOTE — ED Provider Notes (Signed)
Medical screening examination/treatment/procedure(s) were performed by non-physician practitioner and as supervising physician I was immediately available for consultation/collaboration.   Nat Christen, MD 06/08/11 [phone]

## 2011-06-12 ENCOUNTER — Encounter: Payer: Self-pay | Admitting: Gastroenterology

## 2011-06-15 ENCOUNTER — Encounter (HOSPITAL_COMMUNITY): Payer: Self-pay | Admitting: Pharmacy Technician

## 2011-06-15 ENCOUNTER — Other Ambulatory Visit: Payer: Self-pay | Admitting: Cardiology

## 2011-06-21 ENCOUNTER — Other Ambulatory Visit: Payer: Self-pay

## 2011-06-21 ENCOUNTER — Emergency Department (HOSPITAL_COMMUNITY): Payer: Medicare Other

## 2011-06-21 ENCOUNTER — Ambulatory Visit (HOSPITAL_COMMUNITY)
Admission: RE | Admit: 2011-06-21 | Discharge: 2011-06-21 | Disposition: A | Discharge status: Home / Self Care | Payer: Medicare Other | Source: Ambulatory Visit | Attending: Cardiology | Admitting: Cardiology

## 2011-06-21 ENCOUNTER — Inpatient Hospital Stay (HOSPITAL_COMMUNITY)
Admission: EM | Admit: 2011-06-21 | Discharge: 2011-06-25 | DRG: 091 | Disposition: A | Discharge status: Home / Self Care | Payer: Medicare Other | Source: Ambulatory Visit | Attending: Cardiovascular Disease | Admitting: Cardiovascular Disease

## 2011-06-21 ENCOUNTER — Encounter (HOSPITAL_COMMUNITY)
Admission: RE | Disposition: A | Discharge status: Home / Self Care | Payer: Self-pay | Source: Ambulatory Visit | Attending: Cardiology

## 2011-06-21 ENCOUNTER — Encounter (HOSPITAL_COMMUNITY): Payer: Self-pay | Admitting: *Deleted

## 2011-06-21 ENCOUNTER — Ambulatory Visit: Payer: Medicare Other | Admitting: Gastroenterology

## 2011-06-21 DIAGNOSIS — I639 Cerebral infarction, unspecified: Secondary | ICD-10-CM | POA: Diagnosis present

## 2011-06-21 DIAGNOSIS — G20A1 Parkinson's disease without dyskinesia, without mention of fluctuations: Secondary | ICD-10-CM | POA: Diagnosis present

## 2011-06-21 DIAGNOSIS — I2510 Atherosclerotic heart disease of native coronary artery without angina pectoris: Secondary | ICD-10-CM | POA: Diagnosis present

## 2011-06-21 DIAGNOSIS — IMO0002 Reserved for concepts with insufficient information to code with codable children: Principal | ICD-10-CM | POA: Diagnosis present

## 2011-06-21 DIAGNOSIS — E119 Type 2 diabetes mellitus without complications: Secondary | ICD-10-CM | POA: Insufficient documentation

## 2011-06-21 DIAGNOSIS — I6340 Cerebral infarction due to embolism of unspecified cerebral artery: Secondary | ICD-10-CM | POA: Diagnosis present

## 2011-06-21 DIAGNOSIS — R410 Disorientation, unspecified: Secondary | ICD-10-CM | POA: Diagnosis present

## 2011-06-21 DIAGNOSIS — Y840 Cardiac catheterization as the cause of abnormal reaction of the patient, or of later complication, without mention of misadventure at the time of the procedure: Secondary | ICD-10-CM | POA: Diagnosis present

## 2011-06-21 DIAGNOSIS — Z8673 Personal history of transient ischemic attack (TIA), and cerebral infarction without residual deficits: Secondary | ICD-10-CM | POA: Diagnosis present

## 2011-06-21 DIAGNOSIS — G9349 Other encephalopathy: Secondary | ICD-10-CM | POA: Diagnosis present

## 2011-06-21 DIAGNOSIS — Z87891 Personal history of nicotine dependence: Secondary | ICD-10-CM

## 2011-06-21 DIAGNOSIS — R471 Dysarthria and anarthria: Secondary | ICD-10-CM | POA: Diagnosis present

## 2011-06-21 DIAGNOSIS — G20 Parkinson's disease: Secondary | ICD-10-CM | POA: Insufficient documentation

## 2011-06-21 DIAGNOSIS — Z79899 Other long term (current) drug therapy: Secondary | ICD-10-CM

## 2011-06-21 DIAGNOSIS — R079 Chest pain, unspecified: Secondary | ICD-10-CM

## 2011-06-21 DIAGNOSIS — R4182 Altered mental status, unspecified: Secondary | ICD-10-CM

## 2011-06-21 DIAGNOSIS — E785 Hyperlipidemia, unspecified: Secondary | ICD-10-CM | POA: Insufficient documentation

## 2011-06-21 DIAGNOSIS — F411 Generalized anxiety disorder: Secondary | ICD-10-CM | POA: Insufficient documentation

## 2011-06-21 DIAGNOSIS — R0602 Shortness of breath: Secondary | ICD-10-CM | POA: Insufficient documentation

## 2011-06-21 DIAGNOSIS — E7800 Pure hypercholesterolemia, unspecified: Secondary | ICD-10-CM | POA: Diagnosis present

## 2011-06-21 DIAGNOSIS — Z7982 Long term (current) use of aspirin: Secondary | ICD-10-CM

## 2011-06-21 HISTORY — PX: CARDIAC CATHETERIZATION: SHX172

## 2011-06-21 HISTORY — DX: Anxiety disorder, unspecified: F41.9

## 2011-06-21 HISTORY — PX: LEFT HEART CATHETERIZATION WITH CORONARY ANGIOGRAM: SHX5451

## 2011-06-21 HISTORY — DX: Cerebral infarction, unspecified: I63.9

## 2011-06-21 HISTORY — DX: Atherosclerotic heart disease of native coronary artery without angina pectoris: I25.10

## 2011-06-21 HISTORY — DX: Headache: R51

## 2011-06-21 LAB — DIFFERENTIAL
Lymphocytes Relative: 9 % — ABNORMAL LOW (ref 12–46)
Monocytes Absolute: 0.9 10*3/uL (ref 0.1–1.0)
Monocytes Relative: 8 % (ref 3–12)
Neutro Abs: 8.7 10*3/uL — ABNORMAL HIGH (ref 1.7–7.7)

## 2011-06-21 LAB — CBC
HCT: 45.4 % (ref 39.0–52.0)
Hemoglobin: 16.1 g/dL (ref 13.0–17.0)
WBC: 10.6 10*3/uL — ABNORMAL HIGH (ref 4.0–10.5)

## 2011-06-21 LAB — URINE MICROSCOPIC-ADD ON

## 2011-06-21 LAB — GLUCOSE, CAPILLARY: Glucose-Capillary: 169 mg/dL — ABNORMAL HIGH (ref 70–99)

## 2011-06-21 LAB — POCT I-STAT, CHEM 8
BUN: 11 mg/dL (ref 6–23)
Calcium, Ion: 1.12 mmol/L (ref 1.12–1.32)
Hemoglobin: 16 g/dL (ref 13.0–17.0)
Sodium: 140 mEq/L (ref 135–145)
TCO2: 21 mmol/L (ref 0–100)

## 2011-06-21 LAB — URINALYSIS, ROUTINE W REFLEX MICROSCOPIC
Bilirubin Urine: NEGATIVE
Hgb urine dipstick: NEGATIVE
Nitrite: NEGATIVE
Specific Gravity, Urine: 1.029 (ref 1.005–1.030)
pH: 8 (ref 5.0–8.0)

## 2011-06-21 LAB — POCT ACTIVATED CLOTTING TIME: Activated Clotting Time: 215 seconds

## 2011-06-21 SURGERY — LEFT HEART CATHETERIZATION WITH CORONARY ANGIOGRAM
Anesthesia: LOCAL

## 2011-06-21 MED ORDER — SODIUM CHLORIDE 0.9 % IV SOLN: 1.0000 mL/kg/h | INTRAVENOUS | Status: DC

## 2011-06-21 MED ORDER — VERAPAMIL HCL 2.5 MG/ML IV SOLN
INTRAVENOUS | Status: AC
  Filled 2011-06-21: qty 2

## 2011-06-21 MED ORDER — LORAZEPAM 2 MG/ML IJ SOLN
1.0000 mg | Freq: Once | INTRAMUSCULAR | Status: AC
  Administered 2011-06-22: 1 mg via INTRAVENOUS
  Filled 2011-06-21 (×2): qty 1

## 2011-06-21 MED ORDER — MIDAZOLAM HCL 2 MG/2ML IJ SOLN
INTRAMUSCULAR | Status: AC
  Filled 2011-06-21: qty 2

## 2011-06-21 MED ORDER — LIDOCAINE HCL (PF) 1 % IJ SOLN
INTRAMUSCULAR | Status: AC
  Filled 2011-06-21: qty 30

## 2011-06-21 MED ORDER — DIAZEPAM 5 MG PO TABS
5.0000 mg | ORAL_TABLET | ORAL | Status: AC
  Administered 2011-06-21: 5 mg via ORAL
  Filled 2011-06-21: qty 1

## 2011-06-21 MED ORDER — SODIUM CHLORIDE 0.9 % IV SOLN
INTRAVENOUS | Status: DC
  Administered 2011-06-21: 1000 mL via INTRAVENOUS
  Administered 2011-06-22 – 2011-06-23 (×4): via INTRAVENOUS

## 2011-06-21 MED ORDER — ONDANSETRON HCL 4 MG/2ML IJ SOLN: 4.0000 mg | Freq: Four times a day (QID) | INTRAMUSCULAR | Status: DC | PRN

## 2011-06-21 MED ORDER — TRIHEXYPHENIDYL HCL 2 MG PO TABS: 2.0000 mg | ORAL_TABLET | Freq: Two times a day (BID) | ORAL | Status: DC

## 2011-06-21 MED ORDER — FENTANYL CITRATE 0.05 MG/ML IJ SOLN
INTRAMUSCULAR | Status: AC
  Filled 2011-06-21: qty 2

## 2011-06-21 MED ORDER — MORPHINE SULFATE 4 MG/ML IJ SOLN: 1.0000 mg | INTRAMUSCULAR | Status: DC | PRN

## 2011-06-21 MED ORDER — PAROXETINE HCL 20 MG PO TABS: 20.0000 mg | ORAL_TABLET | Freq: Every day | ORAL | Status: DC

## 2011-06-21 MED ORDER — SODIUM CHLORIDE 0.9 % IJ SOLN: 3.0000 mL | INTRAMUSCULAR | Status: DC | PRN

## 2011-06-21 MED ORDER — NITROGLYCERIN 0.2 MG/ML ON CALL CATH LAB
INTRAVENOUS | Status: AC
  Filled 2011-06-21: qty 1

## 2011-06-21 MED ORDER — ADENOSINE 12 MG/4ML IV SOLN
16.0000 mL | INTRAVENOUS | Status: DC
  Filled 2011-06-21: qty 16

## 2011-06-21 MED ORDER — GI COCKTAIL ~~LOC~~: 30.0000 mL | ORAL | Status: DC

## 2011-06-21 MED ORDER — METFORMIN HCL ER (MOD) 500 MG PO TB24: 500.0000 mg | ORAL_TABLET | Freq: Every day | ORAL | Status: DC

## 2011-06-21 MED ORDER — CARBIDOPA-LEVODOPA 25-100 MG PO TABS: 1.0000 | ORAL_TABLET | Freq: Three times a day (TID) | ORAL | Status: DC

## 2011-06-21 MED ORDER — HEPARIN (PORCINE) IN NACL 2-0.9 UNIT/ML-% IJ SOLN
INTRAMUSCULAR | Status: AC
  Filled 2011-06-21: qty 2000

## 2011-06-21 MED ORDER — SODIUM CHLORIDE 0.9 % IV SOLN
INTRAVENOUS | Status: DC
  Administered 2011-06-21: 08:00:00 via INTRAVENOUS

## 2011-06-21 MED ORDER — ACETAMINOPHEN 325 MG PO TABS: 650.0000 mg | ORAL_TABLET | ORAL | Status: DC | PRN

## 2011-06-21 MED ORDER — ROPINIROLE HCL 0.25 MG PO TABS: 0.2500 mg | ORAL_TABLET | Freq: Three times a day (TID) | ORAL | Status: DC

## 2011-06-21 NOTE — Consult Note (Signed)
Reason for Consult:encephalopathy  Referring Physician: Dr.Pickering   CC: confusion  HPI: Kirk Rosario is an 71 y.o. male white presenting in the ER with confusion and headache. He is currently not answering questions appropriately so most of the history is from his wife.  The patient underwent a heart catherization today around 5 pm. Afterwards he had a headache. When the patient arrived home, he became confused and told his wife he felt like he was going to die.  He refused a call to 911 so his wife went to the neighbor's and asked them to come help. The patient didn't recognize his neighbor's and at that point they decided to call EMS.  The patient has undergone a heart cath before without similar symptoms afterwards. No stents were placed today but the procedure was done via his right arm as the patient has restless leg syndrome and is unable to keep his legs still. He had an aspirin this morning and another aspiring after the procedure. He continues to complain of a headache but further history is unobtainable.  Past Medical History  Diagnosis Date  . Diabetes mellitus   . Parkinson disease   . Hypercholesteremia   . Chest pain at rest 06/07/2011  . DM (diabetes mellitus) 06/07/2011  . Hernia, hiatal     Past Surgical History  Procedure Date  . Hand surgery     Family History  Problem Relation Age of Onset  . Heart failure Mother   . Cancer Father     Social History:  reports that he quit smoking about 45 years ago. He has never used smokeless tobacco. He reports that he does not drink alcohol or use illicit drugs.  No Known Allergies  Medications: I have reviewed the patient's current medications.  ROS: Unable to obtain Physical Examination: Blood pressure 164/66, pulse 75, temperature 98.8 F (37.1 C), temperature source Oral, resp. rate 20, SpO2 99.00%. General: alert, appears in slight distress; not following commands or answering appropriately, attention slightly  decreased: exam limited for theses reasons Neurologic Examination Speech fluent but slightly dysarthric; PERRL, EOMI grossly intact, no facial droop Motor: moving all four limbs spontaneously, increased tone with some cogwheel rigidity LE's Reflexes: LE elevated compared to UE   Results for orders placed during the hospital encounter of 06/21/11 (from the past 48 hour(s))  CBC     Status: Abnormal   Collection Time   06/21/11  9:11 PM      Component Value Range Comment   WBC 10.6 (*) 4.0 - 10.5 (K/uL)    RBC 5.42  4.22 - 5.81 (MIL/uL)    Hemoglobin 16.1  13.0 - 17.0 (g/dL)    HCT 96.0  45.4 - 09.8 (%)    MCV 83.8  78.0 - 100.0 (fL)    MCH 29.7  26.0 - 34.0 (pg)    MCHC 35.5  30.0 - 36.0 (g/dL)    RDW 11.9  14.7 - 82.9 (%)    Platelets 214  150 - 400 (K/uL)   DIFFERENTIAL     Status: Abnormal   Collection Time   06/21/11  9:11 PM      Component Value Range Comment   Neutrophils Relative 82 (*) 43 - 77 (%)    Neutro Abs 8.7 (*) 1.7 - 7.7 (K/uL)    Lymphocytes Relative 9 (*) 12 - 46 (%)    Lymphs Abs 1.0  0.7 - 4.0 (K/uL)    Monocytes Relative 8  3 - 12 (%)  Monocytes Absolute 0.9  0.1 - 1.0 (K/uL)    Eosinophils Relative 0  0 - 5 (%)    Eosinophils Absolute 0.0  0.0 - 0.7 (K/uL)    Basophils Relative 0  0 - 1 (%)    Basophils Absolute 0.0  0.0 - 0.1 (K/uL)   TROPONIN I     Status: Normal   Collection Time   06/21/11  9:12 PM      Component Value Range Comment   Troponin I <0.30  <0.30 (ng/mL)   POCT I-STAT, CHEM 8     Status: Abnormal   Collection Time   06/21/11  9:31 PM      Component Value Range Comment   Sodium 140  135 - 145 (mEq/L)    Potassium 3.5  3.5 - 5.1 (mEq/L)    Chloride 105  96 - 112 (mEq/L)    BUN 11  6 - 23 (mg/dL)    Creatinine, Ser 0.86  0.50 - 1.35 (mg/dL)    Glucose, Bld 578 (*) 70 - 99 (mg/dL)    Calcium, Ion 4.69  1.12 - 1.32 (mmol/L)    TCO2 21  0 - 100 (mmol/L)    Hemoglobin 16.0  13.0 - 17.0 (g/dL)    HCT 62.9  52.8 - 41.3 (%)     No  results found for this or any previous visit (from the past 240 hour(s)).  Ct Head Wo Contrast  06/21/2011  *RADIOLOGY REPORT*  Clinical Data: Confusion and slurred speech after catheterization today.  CT HEAD WITHOUT CONTRAST  Technique:  Contiguous axial images were obtained from the base of the skull through the vertex without contrast.  Comparison: MRI 01/26/2010  Findings: Mild diffuse cerebral atrophy.  No mass effect or midline shift.  No abnormal extra-axial fluid collections.  Ventricles are not dilated.  Gray-white matter junctions are distinct.  Basal cisterns are not effaced.  No evidence of acute intracranial hemorrhage.  Vague hyperintense changes along the periphery of the lungs consistent with artifact.  No depressed skull fractures. Mucosal membrane thickening in the sphenoid sinus and right maxillary antrum with opacification of some of the right ethmoid air cells and the right frontal sinus.  No depressed skull fractures visualized.  IMPRESSION: No evidence of acute intracranial hemorrhage, mass lesion, or acute infarct.  Mild inflammatory changes in the paranasal sinuses.  Original Report Authenticated By: Marlon Pel, M.D.   Dg Chest Port 1 View  06/21/2011  *RADIOLOGY REPORT*  Clinical Data: Chest and epigastric pain starting this evening.  PORTABLE CHEST - 1 VIEW  Comparison: 06/07/2011  Findings: Shallow inspiration.  Heart size and pulmonary vascularity are normal for technique.  No blunting of costophrenic angles.  No focal airspace consolidation.  No pneumothorax. Calcification or foreign body in the right upper chest.  Stable appearance since previous study.  Nodular opacities seen previously are not demonstrated today.  Degenerative change in the thoracic spine.  Calcification of the aorta.  IMPRESSION: No evidence of active pulmonary disease.  Original Report Authenticated By: Marlon Pel, M.D.     Assessment/Plan: 71 yo WM PMH HLD, DM, PD and RLS presenting in  ER with encephalopathy/headache post-heart catherization; pt received aspirin today; although exam and history is limited, this could possibly be a stroke considering the patient has risk factors. However, he is outside the time window for tpa treatment intravenously.  We are still in the 8 hour window for possible intraarterial intervention and will obtain further imaging of brain and  cerebral vessels to assess for acute ischemia.  1. MRI brain with DWI and MRA head stat 2. Above discussed with the family, cardiology and interventional neuroradiology.   Arita Miss, MD [facility] 06/21/2011, 11:21 PM

## 2011-06-21 NOTE — ED Notes (Signed)
Pt showing signs of agitation, trying to get out of bed and taking off ekg lines and iv site. EDP informed. ORder for Ativan obtained. Verbal order for restraints PRN received.

## 2011-06-21 NOTE — H&P (Signed)
Kirk Rosario is an 71 y.o. male.   Chief Complaint: AMS changes HPI: Kirk Rosario is a 71 y/o WM who [resents to [facility] ED after being D/c home earlier today post LHC completed due to [place] concerns. The D/x LHCnoted some D/z of LCX, but no PCI ensued. Post Op Kirk Rosario reportedly had a sever HA with associated photophobia that progressed to AMS changes primarily confusion, confabulation and disorientation. Kirk Rosario states Kirk Rosario didn't complain of any CP, increased WOB but did say he was nauseated but no vomiting. There is no report of visual changes focal weakness, dysarthria or hemiplegia.   Past Medical History  Diagnosis Date  . Diabetes mellitus   . Parkinson disease   . Hypercholesteremia   . Chest pain at rest 06/07/2011  . DM (diabetes mellitus) 06/07/2011  . Hernia, hiatal     Past Surgical History  Procedure Date  . Hand surgery     Family History  Problem Relation Age of Onset  . Heart failure Mother   . Cancer Father    Social History:  reports that he quit smoking about 45 years ago. He has never used smokeless tobacco. He reports that he does not drink alcohol or use illicit drugs.  Allergies: No Known Allergies  Medications Prior to Admission  Medication Dose Route Frequency Provider Last Rate Last Dose  . 0.9 %  sodium chloride infusion   Intravenous Continuous Juliet Rude. Pickering, MD 125 mL/hr at 06/21/11 2131 1,000 mL at 06/21/11 2131  . diazepam (VALIUM) tablet 5 mg  5 mg Oral On Call    5 mg at 06/21/11 0758  . fentaNYL (SUBLIMAZE) 0.05 MG/ML injection           . heparin 2-0.9 UNIT/ML-% infusion           . lidocaine (XYLOCAINE) 1 % injection           . LORazepam (ATIVAN) injection 1 mg  1 mg Intravenous Once [employer]. Rubin Payor, MD      . midazolam (VERSED) 2 MG/2ML injection           . nitroGLYCERIN (NTG ON-CALL) 0.2 mg/mL injection           . verapamil (ISOPTIN) 2.5 MG/ML injection           . DISCONTD: 0.9 %  sodium chloride infusion   Intravenous Continuous  75  mL/hr at 06/21/11 0805    . DISCONTD: 0.9 %  sodium chloride infusion  1 mL/kg/hr Intravenous Continuous Kirk Lex, MD      . DISCONTD: acetaminophen (TYLENOL) tablet 650 mg  650 mg Oral Q4H PRN Kirk Lex, MD      . DISCONTD: adenosine (ADENOCARD) 12 MG/4ML injection 48 mg  16 mL Intravenous To Cath Kirk Lex, MD      . DISCONTD: carbidopa-levodopa (SINEMET) 25-100 MG per tablet 1 tablet  1 tablet Oral TID Kirk Lex, MD      . DISCONTD: gi cocktail  30 mL Oral STAT Leone Brand, NP      . DISCONTD: morphine 4 MG/ML injection 1 mg  1 mg Intravenous Q1H PRN Kirk Lex, MD      . DISCONTD: ondansetron [facility]) injection 4 mg  4 mg Intravenous Q6H PRN Kirk Lex, MD      . DISCONTD: PARoxetine (PAXIL) tablet 20 mg  20 mg Oral Daily Kirk Lex, MD      . DISCONTD: rOPINIRole (REQUIP) tablet 0.25 mg  0.25 mg Oral TID Kirk Lex, MD      . DISCONTD: sodium chloride 0.9 % injection 3 mL  3 mL Intravenous PRN       . DISCONTD: trihexyphenidyl (ARTANE) tablet 2 mg  2 mg Oral BID Kirk Lex, MD       Medications Prior to Admission  Medication Sig Dispense Refill  . aspirin EC 325 MG tablet Take 325 mg by mouth daily as needed. For pain       . carbidopa-levodopa (SINEMET) 25-100 MG per tablet Take 1 tablet by mouth 3 (three) times daily.        Marland Kitchen PARoxetine (PAXIL) 20 MG tablet Take 20 mg by mouth daily.        . pravastatin (PRAVACHOL) 40 MG tablet Take 40 mg by mouth every evening.        . trihexyphenidyl (ARTANE) 2 MG tablet Take 2 mg by mouth 2 (two) times daily.          Results for orders placed during the hospital encounter of 06/21/11 (from the past 48 hour(s))  CBC     Status: Abnormal   Collection Time   06/21/11  9:11 PM      Component Value Range Comment   WBC 10.6 (*) 4.0 - 10.5 (K/uL)    RBC 5.42  4.22 - 5.81 (MIL/uL)    Hemoglobin 16.1  13.0 - 17.0 (g/dL)    HCT 16.1  09.6 - 04.5 (%)    MCV 83.8  78.0 - 100.0 (fL)    MCH 29.7   26.0 - 34.0 (pg)    MCHC 35.5  30.0 - 36.0 (g/dL)    RDW 40.9  81.1 - 91.4 (%)    Platelets 214  150 - 400 (K/uL)   DIFFERENTIAL     Status: Abnormal   Collection Time   06/21/11  9:11 PM      Component Value Range Comment   Neutrophils Relative 82 (*) 43 - 77 (%)    Neutro Abs 8.7 (*) 1.7 - 7.7 (K/uL)    Lymphocytes Relative 9 (*) 12 - 46 (%)    Lymphs Abs 1.0  0.7 - 4.0 (K/uL)    Monocytes Relative 8  3 - 12 (%)    Monocytes Absolute 0.9  0.1 - 1.0 (K/uL)    Eosinophils Relative 0  0 - 5 (%)    Eosinophils Absolute 0.0  0.0 - 0.7 (K/uL)    Basophils Relative 0  0 - 1 (%)    Basophils Absolute 0.0  0.0 - 0.1 (K/uL)   TROPONIN I     Status: Normal   Collection Time   06/21/11  9:12 PM      Component Value Range Comment   Troponin I <0.30  <0.30 (ng/mL)   POCT I-STAT, CHEM 8     Status: Abnormal   Collection Time   06/21/11  9:31 PM      Component Value Range Comment   Sodium 140  135 - 145 (mEq/L)    Potassium 3.5  3.5 - 5.1 (mEq/L)    Chloride 105  96 - 112 (mEq/L)    BUN 11  6 - 23 (mg/dL)    Creatinine, Ser 7.82  0.50 - 1.35 (mg/dL)    Glucose, Bld 956 (*) 70 - 99 (mg/dL)    Calcium, Ion 2.13  1.12 - 1.32 (mmol/L)    TCO2 21  0 - 100 (mmol/L)    Hemoglobin 16.0  13.0 -  17.0 (g/dL)    HCT 03.4  74.2 - 59.5 (%)    Ct Head Wo Contrast  06/21/2011  *RADIOLOGY REPORT*  Clinical Data: Confusion and slurred speech after catheterization today.  CT HEAD WITHOUT CONTRAST  Technique:  Contiguous axial images were obtained from the base of the skull through the vertex without contrast.  Comparison: MRI 01/26/2010  Findings: Mild diffuse cerebral atrophy.  No mass effect or midline shift.  No abnormal extra-axial fluid collections.  Ventricles are not dilated.  Gray-white matter junctions are distinct.  Basal cisterns are not effaced.  No evidence of acute intracranial hemorrhage.  Vague hyperintense changes along the periphery of the lungs consistent with artifact.  No depressed skull  fractures. Mucosal membrane thickening in the sphenoid sinus and right maxillary antrum with opacification of some of the right ethmoid air cells and the right frontal sinus.  No depressed skull fractures visualized.  IMPRESSION: No evidence of acute intracranial hemorrhage, mass lesion, or acute infarct.  Mild inflammatory changes in the paranasal sinuses.  Original Report Authenticated By: Marlon Pel, M.D.   Dg Chest Port 1 View  06/21/2011  *RADIOLOGY REPORT*  Clinical Data: Chest and epigastric pain starting this evening.  PORTABLE CHEST - 1 VIEW  Comparison: 06/07/2011  Findings: Shallow inspiration.  Heart size and pulmonary vascularity are normal for technique.  No blunting of costophrenic angles.  No focal airspace consolidation.  No pneumothorax. Calcification or foreign body in the right upper chest.  Stable appearance since previous study.  Nodular opacities seen previously are not demonstrated today.  Degenerative change in the thoracic spine.  Calcification of the aorta.  IMPRESSION: No evidence of active pulmonary disease.  Original Report Authenticated By: Marlon Pel, M.D.    Review of Systems  Unable to perform ROS: mental status change    Blood pressure 164/66, pulse 75, temperature 98.8 F (37.1 C), temperature source Oral, resp. rate 20, SpO2 99.00%. Physical Exam   Gen: confused, agitated Wm NAD Heent: PERRL bilat Neck: soft, supple, no palpable adenopathy, no ausc carotid bruits Integument: W/D Pulm: CTA Cardio: Audible S1, S2 no M/G/R Ext: non edematous Psych: agitated, confused and disorientated Neuro: unable to follow simple commands, DTR 2/4 symmetrical, no Focal neuro deficits appreciated  Assessment/Plan 1) AMS changes/ Encephalopathy post LHC, neuro c/s, check carotid dopplers, possible Neuro interventional eval 2) Parkinsons 3) DM 4) HLD 5) [place] 6) preserved LV fxn with Normal LVEDP per LHC 1/30 7) moderate mid LAD and sm non dominant, rt  ventricular marginal d/z per LHC 1/30 8) 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

## 2011-06-21 NOTE — ED Notes (Signed)
After administering Ativan and assisting pt with voiding, pt calmer. Pt tried voiding on the floor, and sitting on the floor.

## 2011-06-21 NOTE — H&P (Signed)
  History and Physical Interval Note:  NAME:  Kirk Rosario   MRN: [id number] DOB:  1941-05-15   ADMIT DATE: 06/21/2011   06/21/2011 9:10 AM  [name] is a 71 y.o. malewith history of the .normal cardiac catheterization to 20,000 as well as a normal mood the stress test in April 2012. He does have Parkinson's type 2 diabetes and anxiety he'll dyslipidemia. He may know mostly localized to [facility] and he's had persistent episodes of spiking short chest discomfort he takes his breath away. He went emergent in January this year with left-sided discomfort and near syncope that was relieved with aspirin. Infiltrate nausea nauseated and in and was diaphoretic. Based on this evaluation and and his anxiety level in the negative Myoview used to just less than a year ago I think the best option to delineate his anatomy would be to proceed with cardiac catheterization therefore is referred today for outpatient evaluation with cardiac catheterization.     Darvin Neighbours has presented today for surgery, with the diagnosis of chest pain The various methods of treatment have been discussed with the patient and family. After consideration of risks, benefits and other options for treatment, the patient has consented to Procedure(s):  LEFT HEART CATHETERIZATION AND CORONARY ANGIOGRAPHY +/- AD HOC PERCUTANEOUS CORONARY INTERVENTION  as a surgical intervention.   The patients' history has been reviewed, patient examined, no change in status from most my  recent note dated January 24 thousand 13 , stable for surgery. I have reviewed the patients' chart and labs. Questions were answered to the patient's satisfaction.    Risks / Complications include, but not limited to: Death, MI, CVA/TIA, VF/VT (with defibrillation), Bradycardia (need for temporary pacer placement), contrast induced nephropathy, bleeding / bruising / hematoma / pseudoaneurysm, vascular or coronary injury (with possible emergent CT or Vascular  Surgery), adverse medication reactions, infection.    The patient (and family) voice understanding and agree to proceed.   I have signed the consent form and placed it on the chart for patient signature and RN witness.     Marykay Lex, M.D., M.S. [facility] [address] [place], [place]  [address]  [phone]  06/21/2011 9:12 AM

## 2011-06-21 NOTE — ED Provider Notes (Signed)
History     CSN: [id number]  Arrival date & time 06/21/11  2031   First MD Initiated Contact with Patient 06/21/11 2042      Chief Complaint  Patient presents with  . Altered Mental Status  . Headache  . Chest Pain   Level V caveat due to altered mental status. (Consider location/radiation/quality/duration/timing/severity/associated sxs/prior treatment) The history is provided by the patient, a relative and the spouse.   patient came in with altered mental status. He had a heart cath done earlier today. It showed some disease to be treated medically. On the way home this afternoon he started complaining of a headache. At around 6:00 he became more altered and was fearful that he is going to die. Family eventually convinced him to come to the ER. Upon my arrival the ER he is unable to tell me his name. He is confused and somewhat slurred speech.  Past Medical History  Diagnosis Date  . Diabetes mellitus   . Parkinson disease   . Hypercholesteremia   . Chest pain at rest 06/07/2011  . DM (diabetes mellitus) 06/07/2011  . Hernia, hiatal     Past Surgical History  Procedure Date  . Hand surgery     Family History  Problem Relation Age of Onset  . Heart failure Mother   . Cancer Father     History  Substance Use Topics  . Smoking status: Former Smoker    Quit date: 05/22/1966  . Smokeless tobacco: Never Used  . Alcohol Use: No      Review of Systems  Unable to perform ROS   Allergies  Review of patient's allergies indicates no known allergies.  Home Medications   Current Outpatient Rx  Name Route Sig Dispense Refill  . ASPIRIN EC 325 MG PO TBEC Oral Take 325 mg by mouth daily as needed. For pain     . CARBIDOPA-LEVODOPA 25-100 MG PO TABS Oral Take 1 tablet by mouth 3 (three) times daily.      Marland Kitchen ESOMEPRAZOLE MAGNESIUM 40 MG PO CPDR Oral Take 40 mg by mouth daily before breakfast.    . PAROXETINE HCL 20 MG PO TABS Oral Take 20 mg by mouth daily.      Marland Kitchen  PRAVASTATIN SODIUM 40 MG PO TABS Oral Take 40 mg by mouth every evening.      Marland Kitchen ROPINIROLE HCL 3 MG PO TABS Oral Take 1.5 mg by mouth 3 (three) times daily.    . TRIHEXYPHENIDYL HCL 2 MG PO TABS Oral Take 2 mg by mouth 2 (two) times daily.      Marland Kitchen METFORMIN HCL ER (MOD) 500 MG PO TB24 Oral Take 500 mg by mouth daily with breakfast.      BP 164/66  Pulse 75  Temp(Src) 98.8 F (37.1 C) (Oral)  Resp 20  SpO2 99%  Physical Exam  Nursing note and vitals reviewed. Constitutional: He appears well-developed and well-nourished.  HENT:  Head: Normocephalic and atraumatic.  Eyes: EOM are normal. Pupils are equal, round, and reactive to light.  Neck: Normal range of motion. Neck supple.  Cardiovascular: Normal rate, regular rhythm and normal heart sounds.   No murmur heard. Pulmonary/Chest: Effort normal and breath sounds normal.  Abdominal: Soft. Bowel sounds are normal. He exhibits no distension and no mass. There is no tenderness. There is no rebound and no guarding.  Musculoskeletal: Normal range of motion. He exhibits no edema.  Neurological: No cranial nerve deficit.       Patient  has some dysarthria. He is confused. Medical telemetry his own name. He moves all extremities. His parkinsonian tremors appeared worse on the right side. Pupils are equal and reactive.  Skin: Skin is warm and dry.  Psychiatric: He has a normal mood and affect.    ED Course  Procedures (including critical care time)  Labs Reviewed  CBC - Abnormal; Notable for the following:    WBC 10.6 (*)    All other components within normal limits  DIFFERENTIAL - Abnormal; Notable for the following:    Neutrophils Relative 82 (*)    Neutro Abs 8.7 (*)    Lymphocytes Relative 9 (*)    All other components within normal limits  URINALYSIS, ROUTINE W REFLEX MICROSCOPIC - Abnormal; Notable for the following:    APPearance CLOUDY (*)    Glucose, UA 100 (*)    Ketones, ur >80 (*)    Protein, ur 30 (*)    All other  components within normal limits  POCT I-STAT, CHEM 8 - Abnormal; Notable for the following:    Glucose, Bld 194 (*)    All other components within normal limits  TROPONIN I  URINE MICROSCOPIC-ADD ON   Ct Head Wo Contrast  06/21/2011  *RADIOLOGY REPORT*  Clinical Data: Confusion and slurred speech after catheterization today.  CT HEAD WITHOUT CONTRAST  Technique:  Contiguous axial images were obtained from the base of the skull through the vertex without contrast.  Comparison: MRI 01/26/2010  Findings: Mild diffuse cerebral atrophy.  No mass effect or midline shift.  No abnormal extra-axial fluid collections.  Ventricles are not dilated.  Gray-white matter junctions are distinct.  Basal cisterns are not effaced.  No evidence of acute intracranial hemorrhage.  Vague hyperintense changes along the periphery of the lungs consistent with artifact.  No depressed skull fractures. Mucosal membrane thickening in the sphenoid sinus and right maxillary antrum with opacification of some of the right ethmoid air cells and the right frontal sinus.  No depressed skull fractures visualized.  IMPRESSION: No evidence of acute intracranial hemorrhage, mass lesion, or acute infarct.  Mild inflammatory changes in the paranasal sinuses.  Original Report Authenticated By: Marlon Pel, M.D.   Dg Chest Port 1 View  06/21/2011  *RADIOLOGY REPORT*  Clinical Data: Chest and epigastric pain starting this evening.  PORTABLE CHEST - 1 VIEW  Comparison: 06/07/2011  Findings: Shallow inspiration.  Heart size and pulmonary vascularity are normal for technique.  No blunting of costophrenic angles.  No focal airspace consolidation.  No pneumothorax. Calcification or foreign body in the right upper chest.  Stable appearance since previous study.  Nodular opacities seen previously are not demonstrated today.  Degenerative change in the thoracic spine.  Calcification of the aorta.  IMPRESSION: No evidence of active pulmonary disease.   Original Report Authenticated By: Marlon Pel, M.D.     1. Altered mental status     Date: 06/21/2011  Rate: 75  Rhythm: normal sinus rhythm  QRS Axis: normal  Intervals: normal  ST/T Wave abnormalities: normal  Conduction Disutrbances:none  Narrative Interpretation:   Old EKG Reviewed: unchanged     MDM  Patient presents with altered mental status. He heart cath today. Around 6:00 he became more confused. His some slurred speech and altered mental status. The time of onset was not known after the family arrived. neurology and cardiology were consulted. By the time that the time of onset was known regarding your Florinef hours after the change in mental status.  He is not a systemic TPA candidate. Neurology consulted with interventional radiology. We will get an MRI acutely to determine further management. His EKG is reassuring. His enzymes are negative he has not hypoglycemic. He was admitted to cardiology      Juliet Rude. Rubin Payor, MD 06/21/11 [phone]

## 2011-06-21 NOTE — ED Notes (Addendum)
Pt arrived via [facility] c/o pain all over, CP, and HA. Pt had Heart catherization at 05:30 today, woke up complaining of pain. EMS states pt seems confused and is having difficulty following commands. EMS VS BP 162/94, HR 74, o2 100%. PT slurred speech, but does not have his dentures in. Hx: of parkinson's. PT administered ASA prior to EMS arrival.

## 2011-06-21 NOTE — Op Note (Signed)
[facility]     CARDIAC CATHETERIZATION REPORT  NAME: Kirk Rosario   MRN: [id number] DOB: Feb 19, 1941   ADMIT DATE:  06/21/2011  Performing Cardiologist: Marykay Lex , M.D., MS Primary Physician: Mickie Hillier, MD, MD Primary Cardiologist:  Marykay Lex, M.D., MS  Procedures Performed:  Left Heart Catheterization via 5 Fr right radial access  Left Ventriculography, (RAO) 11 ml/sec for 33 ml total contrast  Native Coronary Angiography  Fractional Flow Reserve Measurement of; Final Ratio 0.95 %  Indication(s): Chest pain or shortness of breath concern for unstable angina  Diabetes type 2 on oral medications  Dyslipidemia  History: 71 y.o. male and with history of the .normal cardiac catheterization to 20,000 as well as a normal mood the stress test in April 2012. He does have Parkinson's type 2 diabetes and anxiety he'll dyslipidemia. He may know mostly localized to [facility] and he's had persistent episodes of spiking short chest discomfort he takes his breath away. He went emergent in January this year with left-sided discomfort and near syncope that was relieved with aspirin. Infiltrate nausea nauseated and in and was diaphoretic. Based on this evaluation and and his anxiety level in the negative Myoview used to just less than a year ago I think the best option to delineate his anatomy would be to proceed with cardiac catheterization therefore is referred today for outpatient evaluation with cardiac catheterization.   Consent: The procedure with Risks/Benefits/Alternatives and Indications was reviewed with the patient ( and family).  All questions were answered.  Consent for signed by MD and patient with RN witness -- placed on chart.  Procedure: The patient was brought to the 2nd Floor [place] [facility] in the fasting state and prepped and draped in the usual sterile fashion for Right groin or radial access. A modified  Allen's test with plethysmography was performed on the right wrist demonstrating adequate Ulnar Artery collateral flow.    Sterile technique was used including antiseptics, cap, gloves, gown, hand hygiene, mask and sheet.  Skin prep: Chlorhexidine;  Time Out: Verified patient identification, verified procedure, site/side was marked, verified correct patient position, special equipment/implants available, medications/allergies/relevent history reviewed, required imaging and test results available.  Performed  The right wrist was anesthetized with 1% subcutaneous Lidocaine.  The right radial artery was accessed using the Seldinger Technique with placement of a 5 Fr Glide Sheath. The sheath was aspirated and flushed.  Then a total of 10 ml of standard Radial Artery Cocktail (see medications) was infused.  Radial Cocktail: 5 mg Verapamil, 400 mcg NTG, 2 ml 2% Lidocaine  A 5 Fr TIG 4.0 Catheter was advanced of over a Versicore wire into the ascending Aorta.  The catheter was used to engage the  Left and  Right  coronary artery.  Multiple cineangiographic views of the  both  coronary artery system(s) were performed.   FFR Procedure: The JR 4 cath was exchanged for a 5 [place] Easy Rad Left guide catheter.  An additional 2000 units of heparin was administered to achieve an ACT greater than 200sec.   At the [facility] was 0 it was advanced out of the guide catheter for normalization. It was then advanced down into the mid circumflex confirmed angiographically. Adenosine was administered at 140 mcg kilogram intravenous total of 2 minutes and 30 seconds. FFR was monitored and rcorded throughout entire time with a peak ratio 0.95. Therefore decision was made to not proceed with intervention of this vessel.  Next, the guide catheter was then exchanged over the Long Exchange Safety J wire for an angled Pigtail catheter that was advanced across the Aortic Valve.  LV hemodynamics were measured Left Ventriculography was  performed.  LV hemodynamics were then re-sampled, and the catheter was pulled back across the Aortic Valve for measurement of "pull-back" gradient.  The catheter and wire was removed completely out of the body.  The sheath was removed in the Cath Lab with a TR band placed at  13  ml Air at  1015 hours (time).  Reverse Allen's test revealed non-occlusive hemostasis.   The patient was transported to the  PACU in  hemodynamic stable, chest pain-free condition.   The patient  was stable before, during and following the procedure.   Patient did tolerate procedure well. There were not complications.  EBL:  less than 10 mL   Medications:  Premedication: 5 mg  Valium,  Sedation:   1 mg IV Versed,  25  mcg IV Fentanyl  Contrast:   135 mL  Omnipaque  IV Heparin: Initial bolus 5500 units, pre-FFR 2000 units (ACT greater than 200)  Radial Cocktail: 5 mg Verapamil, 400 mcg NTG, 2 ml 2% Lidocaine  Hemodynamics:  Central Aortic Pressure / Mean Aortic Pressure: 110/69 mmHg; 88 mmHg  LV Pressure / LV End diastolic Pressure:  110/12 mmHg; 14 mmHg  Left Ventriculography:  EF:  60%  Wall Motion: Normal   Coronary Angiographic Data:  Left Main:  Angiographically normal trifurcates into LAD and the Left Circumflex, and ramus intermedius  Left Anterior Descending (LAD):   moderate caliber vessel gives rise to a major diagonal branches that are at least the same diameter as the main trunk. There is a 40% tubular lesion in the mid LAD just after D2. The remainder the vessel system including D1 and D2. There is at least one large and several smaller septal perforators.   Circumflex (LCx):   large caliber dominant vessel, there is an eccentric irregular 60-70% lesion followed by 20% lesion prior to giving rise to a very small obtuse marginal branch -- FFR evaluation of the proximal lesion demonstrated a ratio of 0.95.  There is a small second marginal and active the circumflex followed by bifurcating the  posterior lateral and a large left posterior descending artery. There minimal luminal irregularities in this vessel system.  Ramus Intermedius:   moderate to large caliber vessel the same size the LAD. It courses her mother is an Obtuse Marginal distribution; minimal luminal irregularities   Right Coronary Artery:  small nondominant vessel gives rise to 2 marginal branches the superior which has a mid 60-70% lesion in a small vessel.   Impression: 1.  Moderate to severe proximal Left Complex lesion (acentric 60-70% angiographically) with FFR evaluation demonstrating not indefinitely significant with ratio of 0.95. 2.  Moderate mid LAD and small nondominant  Right Ventricular Marginal disease 3.  Preserved LV function with normal LVEDP.   Plan:  Optimize medical management.  Discharge today after bedrest and post radial care  [place] back in followup to continue to adjust medications.  The case and results was discussed with the patient (and family). The case and results was not discussed with the patient's PCP. The case and results was not discussed with the patient's Cardiologist.  Time Spend Directly with Patient:  50 minutes  HARDING,Lew W, M.D., M.S. [facility] [address] [place], [place]  [address]  [phone]  06/21/2011 10:39 AM

## 2011-06-22 ENCOUNTER — Inpatient Hospital Stay (HOSPITAL_COMMUNITY): Payer: Medicare Other

## 2011-06-22 ENCOUNTER — Encounter (HOSPITAL_COMMUNITY): Payer: Self-pay | Admitting: General Practice

## 2011-06-22 ENCOUNTER — Emergency Department (HOSPITAL_COMMUNITY): Payer: Medicare Other

## 2011-06-22 LAB — COMPREHENSIVE METABOLIC PANEL
ALT: 24 U/L (ref 0–53)
Alkaline Phosphatase: 98 U/L (ref 39–117)
BUN: 11 mg/dL (ref 6–23)
CO2: 23 mEq/L (ref 19–32)
Calcium: 8.7 mg/dL (ref 8.4–10.5)
GFR calc Af Amer: >90 mL/min (ref >90)
GFR calc non Af Amer: 86 mL/min — ABNORMAL LOW (ref >90)
Glucose, Bld: 153 mg/dL — ABNORMAL HIGH (ref 70–99)
Sodium: 139 mEq/L (ref 135–145)
Total Protein: 6.1 g/dL (ref 6.0–8.3)

## 2011-06-22 LAB — MAGNESIUM: Magnesium: 1.9 mg/dL (ref 1.5–2.5)

## 2011-06-22 LAB — GLUCOSE, CAPILLARY
Glucose-Capillary: 148 mg/dL — ABNORMAL HIGH (ref 70–99)
Glucose-Capillary: 122 mg/dL — ABNORMAL HIGH (ref 70–99)
Glucose-Capillary: 143 mg/dL — ABNORMAL HIGH (ref 70–99)

## 2011-06-22 LAB — CBC
HCT: 42.6 % (ref 39.0–52.0)
Hemoglobin: 14.9 g/dL (ref 13.0–17.0)
MCH: 29.6 pg (ref 26.0–34.0)
MCHC: 35 g/dL (ref 30.0–36.0)
MCV: 84.7 fL (ref 78.0–100.0)
RDW: 12.9 % (ref 11.5–15.5)

## 2011-06-22 MED ORDER — ONDANSETRON HCL 4 MG/2ML IJ SOLN: 4.0000 mg | Freq: Four times a day (QID) | INTRAMUSCULAR | Status: DC | PRN

## 2011-06-22 MED ORDER — INSULIN ASPART 100 UNIT/ML ~~LOC~~ SOLN: 0.0000 [IU] | Freq: Every day | SUBCUTANEOUS | Status: DC

## 2011-06-22 MED ORDER — ASPIRIN 325 MG PO TABS
325.0000 mg | ORAL_TABLET | Freq: Every day | ORAL | Status: DC
  Administered 2011-06-23 – 2011-06-25 (×3): 325 mg via ORAL
  Filled 2011-06-22 (×3): qty 1

## 2011-06-22 MED ORDER — ASPIRIN EC 325 MG PO TBEC
325.0000 mg | DELAYED_RELEASE_TABLET | Freq: Once | ORAL | Status: DC
  Filled 2011-06-22: qty 1

## 2011-06-22 MED ORDER — HALOPERIDOL LACTATE 5 MG/ML IJ SOLN
2.0000 mg | Freq: Four times a day (QID) | INTRAMUSCULAR | Status: DC | PRN
  Filled 2011-06-22: qty 0.4

## 2011-06-22 MED ORDER — SIMVASTATIN 20 MG PO TABS
20.0000 mg | ORAL_TABLET | Freq: Every day | ORAL | Status: DC
  Administered 2011-06-22 – 2011-06-24 (×3): 20 mg via ORAL
  Filled 2011-06-22 (×4): qty 1

## 2011-06-22 MED ORDER — METFORMIN HCL ER 500 MG PO TB24
500.0000 mg | ORAL_TABLET | Freq: Every day | ORAL | Status: DC
  Administered 2011-06-24 – 2011-06-25 (×2): 500 mg via ORAL
  Filled 2011-06-22 (×5): qty 1

## 2011-06-22 MED ORDER — PAROXETINE HCL 20 MG PO TABS
20.0000 mg | ORAL_TABLET | Freq: Every day | ORAL | Status: DC
  Administered 2011-06-22 – 2011-06-25 (×4): 20 mg via ORAL
  Filled 2011-06-22 (×4): qty 1

## 2011-06-22 MED ORDER — CARBIDOPA-LEVODOPA 25-100 MG PO TABS
1.0000 | ORAL_TABLET | Freq: Three times a day (TID) | ORAL | Status: DC
  Administered 2011-06-22 – 2011-06-25 (×11): 1 via ORAL
  Filled 2011-06-22 (×14): qty 1

## 2011-06-22 MED ORDER — TRIHEXYPHENIDYL HCL 2 MG PO TABS
2.0000 mg | ORAL_TABLET | Freq: Two times a day (BID) | ORAL | Status: DC
  Administered 2011-06-22 – 2011-06-25 (×7): 2 mg via ORAL
  Filled 2011-06-22 (×11): qty 1

## 2011-06-22 MED ORDER — HALOPERIDOL LACTATE 5 MG/ML IJ SOLN
2.0000 mg | Freq: Once | INTRAMUSCULAR | Status: AC
  Administered 2011-06-22: 2 mg via INTRAMUSCULAR
  Filled 2011-06-22: qty 0.4

## 2011-06-22 MED ORDER — LORAZEPAM 2 MG/ML IJ SOLN
INTRAMUSCULAR | Status: AC
  Administered 2011-06-22: 1 mg via INTRAVENOUS
  Filled 2011-06-22: qty 1

## 2011-06-22 MED ORDER — INSULIN ASPART 100 UNIT/ML ~~LOC~~ SOLN
0.0000 [IU] | Freq: Three times a day (TID) | SUBCUTANEOUS | Status: DC
  Administered 2011-06-22 (×2): 2 [IU] via SUBCUTANEOUS
  Administered 2011-06-23 (×2): 3 [IU] via SUBCUTANEOUS
  Administered 2011-06-23 – 2011-06-24 (×2): 2 [IU] via SUBCUTANEOUS
  Filled 2011-06-22: qty 3

## 2011-06-22 MED ORDER — LORAZEPAM 2 MG/ML IJ SOLN
2.0000 mg | INTRAMUSCULAR | Status: DC | PRN
  Administered 2011-06-22: 2 mg via INTRAVENOUS
  Filled 2011-06-22: qty 1

## 2011-06-22 MED ORDER — PANTOPRAZOLE SODIUM 40 MG PO TBEC
40.0000 mg | DELAYED_RELEASE_TABLET | Freq: Every day | ORAL | Status: DC
  Administered 2011-06-22 – 2011-06-25 (×4): 40 mg via ORAL
  Filled 2011-06-22 (×4): qty 1

## 2011-06-22 MED ORDER — FLEET ENEMA 7-19 GM/118ML RE ENEM
1.0000 | ENEMA | Freq: Once | RECTAL | Status: AC | PRN
  Filled 2011-06-22: qty 1

## 2011-06-22 MED ORDER — ROPINIROLE HCL 1 MG PO TABS
1.5000 mg | ORAL_TABLET | Freq: Three times a day (TID) | ORAL | Status: DC
  Administered 2011-06-22 – 2011-06-25 (×10): 1.5 mg via ORAL
  Filled 2011-06-22 (×12): qty 1

## 2011-06-22 MED ORDER — ASPIRIN 300 MG RE SUPP
300.0000 mg | Freq: Every day | RECTAL | Status: DC
  Filled 2011-06-22: qty 1

## 2011-06-22 NOTE — Progress Notes (Signed)
[facility]  Subjective: Patient confused.  Objective: Vital signs in last 24 hours: Temp:  [97.7 F (36.5 C)-98.8 F (37.1 C)] 98.6 F (37 C) (01/31 0219) Pulse Rate:  [50-90] 81  (01/31 0219) Resp:  [16-20] 20  (01/31 0219) BP: (104-164)/(42-83) 104/60 mmHg (01/31 0219) SpO2:  [92 %-100 %] 92 % (01/31 0219)    Intake/Output from previous day:   Intake/Output this shift:    Medications Current Facility-Administered Medications  Medication Dose Route Frequency Provider Last Rate Last Dose  . 0.9 %  sodium chloride infusion   Intravenous Continuous Juliet Rude. Pickering, MD 125 mL/hr at 06/21/11 2131 1,000 mL at 06/21/11 2131  . aspirin EC tablet 325 mg  325 mg Oral Once Kerry Hough, PA      . aspirin suppository 300 mg  300 mg Rectal Daily Rajani Caesar, MD      . carbidopa-levodopa (SINEMET) 25-100 MG per tablet 1 tablet  1 tablet Oral TID WC Kerry Hough, PA      . haloperidol lactate (HALDOL) injection 2 mg  2 mg Intramuscular Once Kerry Hough, PA   2 mg at 06/22/11 0543  . insulin aspart (novoLOG) injection 0-15 Units  0-15 Units Subcutaneous TID WC Kerry Hough, PA      . insulin aspart (novoLOG) injection 0-5 Units  0-5 Units Subcutaneous QHS Kerry Hough, PA      . LORazepam (ATIVAN) 2 MG/ML injection        1 mg at 06/22/11 0100  . LORazepam (ATIVAN) injection 1 mg  1 mg Intravenous Once [employer]. Pickering, MD   1 mg at 06/22/11 0044  . metFORMIN (GLUCOPHAGE-XR) 24 hr tablet 500 mg  500 mg Oral Q breakfast Kerry Hough, PA      . ondansetron [facility]) injection 4 mg  4 mg Intravenous Q6H PRN Kerry Hough, PA      . pantoprazole (PROTONIX) EC tablet 40 mg  40 mg Oral Daily Kerry Hough, PA      . PARoxetine (PAXIL) tablet 20 mg  20 mg Oral Daily Kerry Hough, PA      . rOPINIRole (REQUIP) tablet 1.5 mg  1.5 mg Oral TID Kerry Hough, PA      . simvastatin (ZOCOR) tablet 20 mg  20 mg Oral q1800 Kerry Hough,  PA      . sodium phosphate (FLEET) 7-19 GM/118ML enema 1 enema  1 enema Rectal Once PRN Kerry Hough, PA      . trihexyphenidyl (ARTANE) tablet 2 mg  2 mg Oral BID Kerry Hough, PA       Facility-Administered Medications Ordered in Other Encounters  Medication Dose Route Frequency Provider Last Rate Last Dose  . fentaNYL (SUBLIMAZE) 0.05 MG/ML injection           . heparin 2-0.9 UNIT/ML-% infusion           . lidocaine (XYLOCAINE) 1 % injection           . midazolam (VERSED) 2 MG/2ML injection           . nitroGLYCERIN (NTG ON-CALL) 0.2 mg/mL injection           . verapamil (ISOPTIN) 2.5 MG/ML injection           . DISCONTD: 0.9 %  sodium chloride infusion   Intravenous Continuous  75 mL/hr at 06/21/11 0805    . DISCONTD: 0.9 %  sodium chloride infusion  1 mL/kg/hr Intravenous Continuous Marykay Lex, MD      . DISCONTD: acetaminophen (TYLENOL) tablet 650 mg  650 mg Oral Q4H PRN Marykay Lex, MD      . DISCONTD: adenosine (ADENOCARD) 12 MG/4ML injection 48 mg  16 mL Intravenous To Cath Marykay Lex, MD      . DISCONTD: carbidopa-levodopa (SINEMET) 25-100 MG per tablet 1 tablet  1 tablet Oral TID Marykay Lex, MD      . DISCONTD: gi cocktail  30 mL Oral STAT Leone Brand, NP      . DISCONTD: morphine 4 MG/ML injection 1 mg  1 mg Intravenous Q1H PRN Marykay Lex, MD      . DISCONTD: ondansetron [facility]) injection 4 mg  4 mg Intravenous Q6H PRN Marykay Lex, MD      . DISCONTD: PARoxetine (PAXIL) tablet 20 mg  20 mg Oral Daily Marykay Lex, MD      . DISCONTD: rOPINIRole (REQUIP) tablet 0.25 mg  0.25 mg Oral TID Marykay Lex, MD      . DISCONTD: sodium chloride 0.9 % injection 3 mL  3 mL Intravenous PRN       . DISCONTD: trihexyphenidyl (ARTANE) tablet 2 mg  2 mg Oral BID Marykay Lex, MD        PE: General appearance: alert and Patient responds to questioning and commands to some extent.  Still confused. Lungs: clear to auscultation bilaterally Heart:  regular rate and rhythm, S1, S2 normal, no murmur, click, rub or gallop Abdomen: soft, non-tender; bowel sounds normal; no masses,  no organomegaly Extremities: No LEE Pulses: 2+ and symmetric Skin: Skin color, texture, turgor normal. No rashes or lesions Neurologic: Alert oriented to self.  Not aware of what is happening.  PERRL, EOMI  Lab Results:   [facility] 06/21/11 2131 06/21/11 2111  WBC -- 10.6*  HGB 16.0 16.1  HCT 47.0 45.4  PLT -- 214   BMET  Basename 06/21/11 2131  NA 140  K 3.5  CL 105  CO2 --  GLUCOSE 194*  BUN 11  CREATININE 1.00  CALCIUM --    Studies/Results: CT HEAD WITHOUT CONTRAST  Technique: Contiguous axial images were obtained from the base of the skull through the vertex without contrast.  Comparison: MRI 01/26/2010  Findings: Mild diffuse cerebral atrophy. No mass effect or midline  shift. No abnormal extra-axial fluid collections. Ventricles are  not dilated. Gray-white matter junctions are distinct. Basal  cisterns are not effaced. No evidence of acute intracranial  hemorrhage. Vague hyperintense changes along the periphery of the  lungs consistent with artifact. No depressed skull fractures.  Mucosal membrane thickening in the sphenoid sinus and right  maxillary antrum with opacification of some of the right ethmoid  air cells and the right frontal sinus. No depressed skull  fractures visualized.  IMPRESSION:  No evidence of acute intracranial hemorrhage, mass lesion, or acute  infarct. Mild inflammatory changes in the paranasal sinuses.    Assessment/Plan  Principal Problem:  *Confusion, postoperative Active Problems:  Parkinson's disease  Hyperlipidemia  Plan:  MRI if patient will cooperate.  Restraints.  IV in the foot.  The patient is not on any BP meds normally.  Will continue to monitor..  We appreciate neuro's assistance.  Haldol for serious agitation.  According to family the patient is articulating better and his words are  understandable.    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 It was performed by Dr. Herbie Baltimore yesterday radially. The pt was D/Cd home yesterday on medical therapy and returned last night with AMS and dysarthria. He has Parkinson's Disease. Neuro exam non focal. Neuro service involved in care. He had an EEG this AM and is scheduled to get an MRI. Suspect that the temporal relation to the cath suggests a peri procedural neurological event though the symptoms appear to be gradually clearing. Will continue to follow and defer to the Neuro service since his cardiovascular situation is stable.   Runell Gess 06/22/2011 10:53 AM

## 2011-06-22 NOTE — ED Notes (Addendum)
Per verbal order by Dr. Lovett Calender. Administered 1 mg Ativan to pt IVP

## 2011-06-22 NOTE — Progress Notes (Signed)
Subjective: Patient is doing better.   Objective: Vital signs in last 24 hours: Temp:  [97.7 F (36.5 C)-98.8 F (37.1 C)] 98.6 F (37 C) (01/31 0219) Pulse Rate:  [57-90] 81  (01/31 0219) Resp:  [16-20] 20  (01/31 0219) BP: (104-164)/(42-83) 104/60 mmHg (01/31 0219) SpO2:  [92 %-100 %] 92 % (01/31 0219)  Nutritional status: NPO  Neurological exam: AAO*3. No aphasia. Followed complex commands. Inattentive. Periodically attempting to untie his restraints despite being asked not to do so. Cranial nerves: EOMI, PERRL. Visual fields were full. Sensation to V1 through V3 areas of the face was intact and symmetric throughout. There was no facial asymmetry. Hearing to finger rub was equal and symmetrical bilaterally. Shoulder shrug was 5/5 and symmetric bilaterally. Head rotation was 5/5 bilaterally. There was no dysarthria or palatal deviation. Motor: strength was 5/5 and symmetric throughout. Sensory: was intact throughout to light touch, pinprick, vibration and proprioception. Coordination: finger-to-nose and heel-to-shin were intact and symmetric bilaterally. Reflexes: were 2+ in upper extremities and 1+ at the knees and 1+ at the ankles. Plantar response was downgoing bilaterally. Gait: deferred  Lab Results:  Basename 06/22/11 0845 06/21/11 2131 06/21/11 2111  WBC 8.5 -- 10.6*  HGB 14.9 16.0 --  HCT 42.6 47.0 --  PLT 174 -- 214  NA -- 140 --  K -- 3.5 --  CL -- 105 --  CO2 -- -- --  GLUCOSE -- 194* --  BUN -- 11 --  CREATININE -- 1.00 --  CALCIUM -- -- --  LABA1C -- -- --   Studies/Results: Ct Head Wo Contrast  06/21/2011  *RADIOLOGY REPORT*  Clinical Data: Confusion and slurred speech after catheterization today.  CT HEAD WITHOUT CONTRAST  Technique:  Contiguous axial images were obtained from the base of the skull through the vertex without contrast.  Comparison: MRI 01/26/2010  Findings: Mild diffuse cerebral atrophy.  No mass effect or midline shift.  No abnormal extra-axial  fluid collections.  Ventricles are not dilated.  Gray-white matter junctions are distinct.  Basal cisterns are not effaced.  No evidence of acute intracranial hemorrhage.  Vague hyperintense changes along the periphery of the lungs consistent with artifact.  No depressed skull fractures. Mucosal membrane thickening in the sphenoid sinus and right maxillary antrum with opacification of some of the right ethmoid air cells and the right frontal sinus.  No depressed skull fractures visualized.  IMPRESSION: No evidence of acute intracranial hemorrhage, mass lesion, or acute infarct.  Mild inflammatory changes in the paranasal sinuses.  Original Report Authenticated By: Marlon Pel, M.D.   Dg Chest Port 1 View  06/21/2011  *RADIOLOGY REPORT*  Clinical Data: Chest and epigastric pain starting this evening.  PORTABLE CHEST - 1 VIEW  Comparison: 06/07/2011  Findings: Shallow inspiration.  Heart size and pulmonary vascularity are normal for technique.  No blunting of costophrenic angles.  No focal airspace consolidation.  No pneumothorax. Calcification or foreign body in the right upper chest.  Stable appearance since previous study.  Nodular opacities seen previously are not demonstrated today.  Degenerative change in the thoracic spine.  Calcification of the aorta.  IMPRESSION: No evidence of active pulmonary disease.  Original Report Authenticated By: Marlon Pel, M.D.   Medications: I have reviewed the patient's current medications.  Assessment/Plan: 70-yeas-old man with confusion, hallucinations and headache after having a cardiac catheter in the morning yesterday - currently patient is doing better clinically and is oriented, but inattentive. The event is not compelling for seizure,  but we will obtain an EEG to rule it out. Then we will attempt MRI (to rule out CVA) with ativan again with hopes that he will tolerate it better given that he is more oriented. Will follow.   LOS: 1 day   Banessa Mao,  Roni

## 2011-06-22 NOTE — Progress Notes (Addendum)
Pt admitted from ED this AM with some confusion and agitation. Pt pulled out the  IV in his Left forearm. Several attempt to restart IV was unsuccessful.  Kerry Hough, PA., aware. He is OK with not restarting IV at this time.

## 2011-06-22 NOTE — Progress Notes (Signed)
Speech Language/Pathology Clinical/Bedside Swallow Evaluation Patient Details  Name: Kirk Rosario MRN: [id number] DOB: 1941-03-21 Today's Date: 06/22/2011  Past Medical History:  Past Medical History  Diagnosis Date  . Diabetes mellitus   . Parkinson disease   . Hypercholesteremia   . Chest pain at rest 06/07/2011  . DM (diabetes mellitus) 06/07/2011  . Hernia, hiatal    Past Surgical History:  Past Surgical History  Procedure Date  . Hand surgery    HPI:  Kirk Rosario is a 71 y.o. white male who presented in the ER with confusion and headache. He was not answering questions appropriately so most of the history is from his wife.  The patient underwent a heart catherization 06/21/11 around 5 pm. Afterwards he had a headache. When the patient arrived home, he became confused and told his wife he felt like he was going to die.  He refused a call to 911 so his wife went to the neighbor's and asked them to come help. The patient didn't recognize his neighbors and at that point they decided to call EMS.  The patient has undergone a heart cath before without similar symptoms afterwards. No stents were placed 06/21/11 but the procedure was done via his right arm as the patient has restless leg syndrome and is unable to keep his legs still. Pt has Parkinson's and a history of GERD. His wife reports that occasionally he has some difficulty swallowing solids. Pt fid not pass the stroke swallow screen because he was unable to follow commands and unable to produce a cough on command.   Assessment/Recommendations/Treatment Plan Suspected Esophageal Findings Suspected Esophageal Findings: Belching  SLP Assessment Clinical Impression Statement: Pt did not present with any overt s/s of aspiration at bedside. Oral motor function WFL. Due to wife's report that he occasionally has difficulty swallowing solids with a globus sensation and his lack of dentition, recommend dys. 3 diet  with thin liquids. SLP  to f/u pending results of MRI as CVA would increase risk of silent aspiration.  Risk for Aspiration: Mild Other Related Risk Factors: History of GERD (AMS)  Swallow Evaluation Recommendations Solid Consistency: Dysphagia 3 (Mechanical soft) Liquid Consistency: Thin Liquid Administration via: Cup;Straw Medication Administration: Whole meds with liquid Supervision: Intermittent supervision to cue for compensatory strategies;Patient able to self feed Compensations: Slow rate;Small sips/bites Postural Changes and/or Swallow Maneuvers: Seated upright 90 degrees Oral Care Recommendations: Oral care BID Follow up Recommendations: None  Treatment Plan Treatment Plan Recommendations:  (TBD pending results of MRI (? indication of silent aspiration)  Prognosis Prognosis for Safe Diet Advancement: Good Barriers to Reach Goals:  (AMS)  Individuals Consulted Consulted and Agree with Results and Recommendations: Family member/caregiver;Patient;RN Family Member Consulted: wife, daughter   Dawayne Patricia General  Date of Onset: 06/21/11 Type of Study: Bedside swallow evaluation Diet Prior to this Study: NPO;IV Temperature Spikes Noted: No Respiratory Status: Room air Behavior/Cognition: Alert;Requires cueing;Cooperative Oral Cavity - Dentition: Edentulous Vision: Functional for self-feeding Patient Positioning: Upright in bed Baseline Vocal Quality: Clear Volitional Cough: Strong Volitional Swallow: Unable to elicit  Oral Motor/Sensory Function  Overall Oral Motor/Sensory Function: Appears within functional limits for tasks assessed  Consistency Results  Ice Chips Ice chips: Not tested  Thin Liquid Thin Liquid: Within functional limits Presentation: Cup;Self Fed;Straw  Nectar Thick Liquid Nectar Thick Liquid: Not tested  Honey Thick Liquid Honey Thick Liquid: Not tested  Puree Puree: Within functional limits Presentation: Self Fed;Spoon  Solid Solid: Within functional  limits Presentation: Self Fed  Maxcine Ham 06/22/2011,10:37 AM   Maxcine Ham, SLP Student

## 2011-06-23 ENCOUNTER — Inpatient Hospital Stay (HOSPITAL_COMMUNITY): Payer: Medicare Other

## 2011-06-23 LAB — CBC
HCT: 41.4 % (ref 39.0–52.0)
Hemoglobin: 14.3 g/dL (ref 13.0–17.0)
MCH: 29.5 pg (ref 26.0–34.0)
MCHC: 34.5 g/dL (ref 30.0–36.0)
RBC: 4.84 MIL/uL (ref 4.22–5.81)

## 2011-06-23 LAB — BASIC METABOLIC PANEL
BUN: 10 mg/dL (ref 6–23)
Chloride: 107 mEq/L (ref 96–112)
GFR calc non Af Amer: 84 mL/min — ABNORMAL LOW (ref >90)
Glucose, Bld: 141 mg/dL — ABNORMAL HIGH (ref 70–99)
Potassium: 3.6 mEq/L (ref 3.5–5.1)
Sodium: 140 mEq/L (ref 135–145)

## 2011-06-23 LAB — GLUCOSE, CAPILLARY
Glucose-Capillary: 138 mg/dL — ABNORMAL HIGH (ref 70–99)
Glucose-Capillary: 148 mg/dL — ABNORMAL HIGH (ref 70–99)

## 2011-06-23 NOTE — Progress Notes (Signed)
*  PRELIMINARY RESULTS* Vascular Ultrasound Carotid Duplex (Doppler) has been completed.  Preliminary findings: Bilaterally no significant ICA stenosis with antegrade vertebral flow.  Farrel Demark RDMS 06/23/2011, 2:22 PM

## 2011-06-23 NOTE — Progress Notes (Signed)
Speech Pathology:  Treatment Note   Subjective:  "I dont remember anything from yesterday"  Objective:  SLP followed up for brief differential diagnosis of swallowing function and diet tolerance assessment given MRI results indicating possible tiny bilateral occipital and posterior left frontal lobe infarcts. SLP provided skilled clinical observation of swallowing function with po trials including crackers and thin liquids. Patient without any overt indication of swallowing difficulty or aspiration.  With the exception of poor appetite, wife does not endorse any difficulty swallowing. Both patient and wife do confirm occasional regurgitation of solid bolus, suspected secondary to esophageal dysphagia, and have an appointment with a GI specialist on 2/18. At this time, patient appears to be tolerating diet. Lungs reported as clear, patient afebrile.   Assessment:  No further skilled SLP needs indicated at this time. Mentation is significantly improving. If cognitive-linguistic evaluation is needed, please order.   Recommendations:  1. Continue current diet with general safe swallowing and reflux precautions.  2. D/c SLP services.   Pain:   none Intervention Required:   No   Goals: Met  Ferdinand Lango MA, CCC-SLP [phone]

## 2011-06-23 NOTE — Progress Notes (Signed)
Pt refused bed alarm. Pt got upset and cursed at RN about having the bed alarm on. Pt educated on importance and safety function of bed alarm. Will continue to monitor and assess pts needs. Steva Colder [facility] 06/23/2011  11:34 PM

## 2011-06-23 NOTE — Procedures (Signed)
EEG NUMBER:  [id number].  HISTORY:  An 71 years old man with one episode of confusion, hallucinations, disorientation after having a cardiac cath procedure.  MEDICATIONS:  Ativan.  CONDITION OF RECORDING:  This 16-lead EEG was recorded with the patient in awake, drowsy, and sleep states.  Background rhythms: background patterns in wakefulness were well-organized with a well-sustained posterior dominant rhythm of 7 to 7.5 Hz, symmetrical and reactive to eye opening and closing.  Drowsiness was associated with mild attenuation of voltage and slowing frequencies.  Normal sleep patterns were seen.  Abnormal potentials:  no epileptiform activity or focal slowing was noted.  ACTIVATION PROCEDURES:  Hyperventilation was not performed.  Photic stimulation did not activate tracing.  EKG:  Single-channel of EKG monitoring detected an irregular rhythm.  IMPRESSION:  This was an abnormal awake, drowsy, and sleep EEG due to presence of mild diffuse background slowing.  These findings may be suggestive of mild diffuse cerebral dysfunction and may be due to toxic metabolic encephalopathy, neurodegenerative disorder, and/or Bi-hemispheric structural abnormality.  Clinical correlation is suggested.          ______________________________ Carmell Austria, MD    [id number] D:  06/22/2011 12:28:15  T:  06/22/2011 21:08:47  Job #:  [id number]

## 2011-06-23 NOTE — Progress Notes (Signed)
Subjective: Doesn't remember much after the heart cath.  Now improved.   Objective: Vital signs in last 24 hours: Temp:  [97 F (36.1 C)-98.5 F (36.9 C)] 98.5 F (36.9 C) (02/01 0409) Pulse Rate:  [66-72] 67  (02/01 0409) Resp:  [18-20] 20  (02/01 0409) BP: (115-148)/(70-77) 148/71 mmHg (02/01 0409) SpO2:  [92 %-97 %] 97 % (02/01 0409) Weight:  [102.2 kg (225 lb 5 oz)] 102.2 kg (225 lb 5 oz) (01/31 1059) Weight change:  Last BM Date: 06/22/11 Intake/Output from previous day:+454 01/31 0701 - 02/01 0700 In: 4665.4 [P.O.:480; I.V.:4185.4] Out: -  Intake/Output this shift:    PE: General:A&O X 3, pleasant affect.  Heart:S1S2, RRR Lungs:clear without rales, rhonchi or wheezes Abd:+ BS, soft non tender. Ext:no edema Neuro: MAE, + facial symmetry, push pull of feet equal.  Tremor of rt. Foot, chronic.  TELE:  SR Lab Results:  Basename 06/23/11 0544 06/22/11 0845  WBC 7.3 8.5  HGB 14.3 14.9  HCT 41.4 42.6  PLT 170 174   BMET  Basename 06/23/11 0544 06/22/11 0845  NA 140 139  K 3.6 3.6  CL 107 105  CO2 21 23  GLUCOSE 141* 153*  BUN 10 11  CREATININE 0.95 0.88  CALCIUM 8.6 8.7    Basename 06/21/11 2112  TROPONINI <0.30    Lab Results  Component Value Date   CHOL  Value: 129        ATP III CLASSIFICATION:  <200     mg/dL   Desirable  161-096  mg/dL   Borderline High  >=045    mg/dL   High        08/28/8117   HDL 31* 08/20/2010   LDLCALC  Value: 66        Total Cholesterol/HDL:CHD Risk Coronary Heart Disease Risk Table                     Men   Women  1/2 Average Risk   3.4   3.3  Average Risk       5.0   4.4  2 X Average Risk   9.6   7.1  3 X Average Risk  23.4   11.0        Use the calculated Patient Ratio above and the CHD Risk Table to determine the patient's CHD Risk.        ATP III CLASSIFICATION (LDL):  <100     mg/dL   Optimal  147-829  mg/dL   Near or Above                    Optimal  130-159  mg/dL   Borderline  562-130  mg/dL   High  >865     mg/dL   Very  High 7/84/6962   TRIG 158* 08/20/2010   CHOLHDL 4.2 08/20/2010   Lab Results  Component Value Date   HGBA1C  Value: 7.5 (NOTE)                                                                       According to the ADA Clinical Practice Recommendations for 2011, when HbA1c is used as a screening test:   >=6.5%   Diagnostic of  Diabetes Mellitus           (if abnormal result  is confirmed)  5.7-6.4%   Increased risk of developing Diabetes Mellitus  References:Diagnosis and Classification of Diabetes Mellitus,Diabetes Care,2011,34(Suppl 1):S62-S69 and Standards of Medical Care in         Diabetes - 2011,Diabetes Care,2011,34  (Suppl 1):S11-S61.* 08/19/2010     Lab Results  Component Value Date   TSH 1.991 06/22/2011    Hepatic Function Panel  Basename 06/22/11 0845  PROT 6.1  ALBUMIN 3.4*  AST 33  ALT 24  ALKPHOS 98  BILITOT 0.8  BILIDIR --  IBILI --   No results found for this basename: CHOL in the last 72 hours No results found for this basename: PROTIME in the last 72 hours    EKG: Orders placed during the hospital encounter of 06/21/11  . ED EKG  . ED EKG  . EKG    Studies/Results: Ct Head Wo Contrast  06/21/2011  *RADIOLOGY REPORT*  Clinical Data: Confusion and slurred speech after catheterization today.  CT HEAD WITHOUT CONTRAST  Technique:  Contiguous axial images were obtained from the base of the skull through the vertex without contrast.  Comparison: MRI 01/26/2010  Findings: Mild diffuse cerebral atrophy.  No mass effect or midline shift.  No abnormal extra-axial fluid collections.  Ventricles are not dilated.  Gray-white matter junctions are distinct.  Basal cisterns are not effaced.  No evidence of acute intracranial hemorrhage.  Vague hyperintense changes along the periphery of the lungs consistent with artifact.  No depressed skull fractures. Mucosal membrane thickening in the sphenoid sinus and right maxillary antrum with opacification of some of the right ethmoid air  cells and the right frontal sinus.  No depressed skull fractures visualized.  IMPRESSION: No evidence of acute intracranial hemorrhage, mass lesion, or acute infarct.  Mild inflammatory changes in the paranasal sinuses.  Original Report Authenticated By: Marlon Pel, M.D.   Mr [facility] Wo Contrast  06/23/2011  *RADIOLOGY REPORT*  Clinical Data:  Confusion and slurred speech after catheterization 2 days ago.  MRI HEAD WITHOUT CONTRAST MRA HEAD WITHOUT CONTRAST  Technique: Multiplanar, multiecho pulse sequences of the brain and surrounding structures were obtained according to standard protocol without intravenous contrast.  Angiographic images of the head were obtained using MRA technique without contrast.  Comparison: 06/21/2011 head CT.  11/27/2009 MR brain.  MRI HEAD  Findings:  Three diffusion sequences were obtained.  It is difficult to confirm with any certainty however, on these sequences, the possibility of tiny acute bilateral occipital lobe and posterior left frontal lobe infarcts is raised.  If these represent true findings, embolic disease is a consideration.  No intracranial hemorrhage.  Scattered white matter type changes consistent with result of small vessel disease.  Mild atrophy without hydrocephalus.  No intracranial mass lesion detected on this unenhanced slightly motion degraded exam.  Small sella/pituitary gland unchanged.  Cervical medullary junction, pineal region and orbital structures unremarkable.  Paranasal sinus opacification most notable right maxillary sinus, right sphenoid sinus and the right frontal sinus.  Major intracranial vascular structures are patent.  IMPRESSION: Three diffusion sequences were obtained.  It is difficult to confirm with any certainty however, on these sequences, the possibility of tiny acute bilateral occipital lobe and posterior left frontal lobe infarcts is raised.  If these represent true findings, embolic disease is a consideration.  No intracranial  hemorrhage.  Scattered white matter type changes consistent with result of small vessel disease.  Mild atrophy without hydrocephalus.  Paranasal sinus opacification most notable right maxillary sinus, right sphenoid sinus and the right frontal sinus.  MRA HEAD  Findings: Mild supraclinoid internal carotid artery narrowing greater on the left.  Mild irregularity left anterior cerebral artery.  Middle cerebral artery branch vessel irregularity.  Mild ectasia and irregularity of the vertebral arteries bilaterally.  Ectatic left PICA without draining vein to suggest arteriovenous malformation.  Ectatic basilar artery without high-grade stenosis.  Nonvisualization left AICA.  Moderate narrowing proximal right superior cerebellar artery.  Mild irregularity proximal right posterior cerebral artery.  Mild posterior cerebral artery distal branch vessel irregularity bilaterally.  No discrete aneurysm noted on this slightly motion degraded exam.  IMPRESSION: Mild intracranial atherosclerotic type changes as detailed above.  Original Report Authenticated By: Fuller Canada, M.D.   Mr Brain Wo Contrast  06/23/2011  *RADIOLOGY REPORT*  Clinical Data:  Confusion and slurred speech after catheterization 2 days ago.  MRI HEAD WITHOUT CONTRAST MRA HEAD WITHOUT CONTRAST  Technique: Multiplanar, multiecho pulse sequences of the brain and surrounding structures were obtained according to standard protocol without intravenous contrast.  Angiographic images of the head were obtained using MRA technique without contrast.  Comparison: 06/21/2011 head CT.  11/27/2009 MR brain.  MRI HEAD  Findings:  Three diffusion sequences were obtained.  It is difficult to confirm with any certainty however, on these sequences, the possibility of tiny acute bilateral occipital lobe and posterior left frontal lobe infarcts is raised.  If these represent true findings, embolic disease is a consideration.  No intracranial hemorrhage.  Scattered white  matter type changes consistent with result of small vessel disease.  Mild atrophy without hydrocephalus.  No intracranial mass lesion detected on this unenhanced slightly motion degraded exam.  Small sella/pituitary gland unchanged.  Cervical medullary junction, pineal region and orbital structures unremarkable.  Paranasal sinus opacification most notable right maxillary sinus, right sphenoid sinus and the right frontal sinus.  Major intracranial vascular structures are patent.  IMPRESSION: Three diffusion sequences were obtained.  It is difficult to confirm with any certainty however, on these sequences, the possibility of tiny acute bilateral occipital lobe and posterior left frontal lobe infarcts is raised.  If these represent true findings, embolic disease is a consideration.  No intracranial hemorrhage.  Scattered white matter type changes consistent with result of small vessel disease.  Mild atrophy without hydrocephalus.  Paranasal sinus opacification most notable right maxillary sinus, right sphenoid sinus and the right frontal sinus.  MRA HEAD  Findings: Mild supraclinoid internal carotid artery narrowing greater on the left.  Mild irregularity left anterior cerebral artery.  Middle cerebral artery branch vessel irregularity.  Mild ectasia and irregularity of the vertebral arteries bilaterally.  Ectatic left PICA without draining vein to suggest arteriovenous malformation.  Ectatic basilar artery without high-grade stenosis.  Nonvisualization left AICA.  Moderate narrowing proximal right superior cerebellar artery.  Mild irregularity proximal right posterior cerebral artery.  Mild posterior cerebral artery distal branch vessel irregularity bilaterally.  No discrete aneurysm noted on this slightly motion degraded exam.  IMPRESSION: Mild intracranial atherosclerotic type changes as detailed above.  Original Report Authenticated By: Fuller Canada, M.D.   Dg Chest Port 1 View  06/21/2011  *RADIOLOGY  REPORT*  Clinical Data: Chest and epigastric pain starting this evening.  PORTABLE CHEST - 1 VIEW  Comparison: 06/07/2011  Findings: Shallow inspiration.  Heart size and pulmonary vascularity are normal for technique.  No blunting of costophrenic angles.  No focal airspace consolidation.  No pneumothorax. Calcification or foreign body in the right upper chest.  Stable appearance since previous study.  Nodular opacities seen previously are not demonstrated today.  Degenerative change in the thoracic spine.  Calcification of the aorta.  IMPRESSION: No evidence of active pulmonary disease.  Original Report Authenticated By: Marlon Pel, M.D.    Medications: I have reviewed the patient's current medications.    Marland Kitchen aspirin  325 mg Oral Daily  . carbidopa-levodopa  1 tablet Oral TID WC  . insulin aspart  0-15 Units Subcutaneous TID WC  . insulin aspart  0-5 Units Subcutaneous QHS  . metFORMIN  500 mg Oral Q breakfast  . pantoprazole  40 mg Oral Daily  . PARoxetine  20 mg Oral Daily  . rOPINIRole  1.5 mg Oral TID  . simvastatin  20 mg Oral q1800  . trihexyphenidyl  2 mg Oral BID  . DISCONTD: aspirin EC  325 mg Oral Once  . DISCONTD: aspirin  300 mg Rectal Daily   Assessment/Plan: Patient Active Problem List  Diagnoses  . Chest pain at rest , negative troponins, patent coronary arteries 1999 and neg. stress myoview 08/2010  . DM (diabetes mellitus)  . Parkinson's disease  . Hyperlipidemia  . Confusion, postoperative,altered mental status  It is difficult to  confirm with any certainty however, on these sequences, the  possibility of tiny acute bilateral occipital lobe and posterior  left frontal lobe infarcts is raised. If these represent true  findings, embolic disease is a consideration.   PLAN:No atrial fib on monitor. Currently improved. Neurology has seen and is following. Appears to have passed swallowing study.      LOS: 2 days   INGOLD,LAURA R 06/23/2011, 10:15 AM  Agree  with note written by Nada Boozer RNP  S/P probable embolic event from recent cath. Neuro changes seem to have cleared.  Carotid dopplers performed. Results pending. Neuro recommends full dose ASA. Probably home in AM.  Nanetta Batty J 06/23/2011 4:16 PM

## 2011-06-23 NOTE — Progress Notes (Signed)
Stroke Team Progress Note  HISTORY Kirk Rosario is an 71 y.o. male white presenting in the ER with confusion and headache. He is currently not answering questions appropriately so most of the history is from his wife. The patient underwent a heart catherization 06/21/2011 around 5 pm. Afterwards he had a headache. When the patient arrived home, he became confused and told his wife he felt like he was going to die. He refused a call to 911 so his wife went to the neighbor's and asked them to come help. The patient didn't recognize his neighbor's and at that point they decided to call EMS. The patient has undergone a heart cath before without similar symptoms afterwards. No stents were placed today but the procedure was done via his right arm as the patient has restless leg syndrome and is unable to keep his legs still.  He had an aspirin this morning and another aspiring after the procedure. He continues to complain of a headache but further history is unobtainable.   SUBJECTIVE His wife is at the bedside. Overall he feels his condition is gradually improving.  OBJECTIVE Filed Vitals:   06/22/11 1059 06/22/11 1425 06/22/11 2026 06/23/11 0409  BP:  137/77 115/70 148/71  Pulse:  66 72 67  Temp:  97 F (36.1 C) 98.1 F (36.7 C) 98.5 F (36.9 C)  TempSrc:  Oral Oral Oral  Resp:  18 18 20   Height: 5\' 10"  (1.778 m)     Weight: 102.2 kg (225 lb 5 oz)     SpO2:  94% 92% 97%   CBG (last 3)  Basename 06/23/11 0648 06/22/11 2210 06/22/11 1704  GLUCAP 138* 143* 122*   Intake/Output from previous day: 01/31 0701 - 02/01 0700 In: 4665.4 [P.O.:480; I.V.:4185.4] Out: -   IV Fluid Intake:     . sodium chloride 125 mL/hr at 06/23/11 0700   Medications    . aspirin  325 mg Oral Daily  . carbidopa-levodopa  1 tablet Oral TID WC  . insulin aspart  0-15 Units Subcutaneous TID WC  . insulin aspart  0-5 Units Subcutaneous QHS  . metFORMIN  500 mg Oral Q breakfast  . pantoprazole  40 mg Oral  Daily  . PARoxetine  20 mg Oral Daily  . rOPINIRole  1.5 mg Oral TID  . simvastatin  20 mg Oral q1800  . trihexyphenidyl  2 mg Oral BID  . DISCONTD: aspirin EC  325 mg Oral Once  . DISCONTD: aspirin  300 mg Rectal Daily  PRN haloperidol lactate, LORazepam, ondansetron (ZOFRAN) IV, sodium phosphate  Diet:  Cardiac thin liquids Activity:  Bedrest  DVT Prophylaxis:    Significant Diagnostic Studies: CBC    Component Value Date/Time   WBC 7.3 06/23/2011 0544   RBC 4.84 06/23/2011 0544   HGB 14.3 06/23/2011 0544   HCT 41.4 06/23/2011 0544   PLT 170 06/23/2011 0544   MCV 85.5 06/23/2011 0544   MCH 29.5 06/23/2011 0544   MCHC 34.5 06/23/2011 0544   RDW 13.0 06/23/2011 0544   LYMPHSABS 1.0 06/21/2011 2111   MONOABS 0.9 06/21/2011 2111   EOSABS 0.0 06/21/2011 2111   BASOSABS 0.0 06/21/2011 2111   CMP    Component Value Date/Time   NA 140 06/23/2011 0544   K 3.6 06/23/2011 0544   CL 107 06/23/2011 0544   CO2 21 06/23/2011 0544   GLUCOSE 141* 06/23/2011 0544   BUN 10 06/23/2011 0544   CREATININE 0.95 06/23/2011 0544   CALCIUM 8.6  06/23/2011 0544   PROT 6.1 06/22/2011 0845   ALBUMIN 3.4* 06/22/2011 0845   AST 33 06/22/2011 0845   ALT 24 06/22/2011 0845   ALKPHOS 98 06/22/2011 0845   BILITOT 0.8 06/22/2011 0845   GFRNONAA 84* 06/23/2011 0544   GFRAA >90 06/23/2011 0544   COAGS Lab Results  Component Value Date   INR 1.05 08/19/2010   Lipid Panel    Component Value Date/Time   CHOL  Value: 129        ATP III CLASSIFICATION:  <200     mg/dL   Desirable  454-098  mg/dL   Borderline High  >=119    mg/dL   High        1/47/8295 0505   TRIG 158* 08/20/2010 0505   HDL 31* 08/20/2010 0505   CHOLHDL 4.2 08/20/2010 0505   VLDL 32 08/20/2010 0505   LDLCALC  Value: 66        Total Cholesterol/HDL:CHD Risk Coronary Heart Disease Risk Table                     Men   Women  1/2 Average Risk   3.4   3.3  Average Risk       5.0   4.4  2 X Average Risk   9.6   7.1  3 X Average Risk  23.4   11.0        Use the calculated Patient Ratio  above and the CHD Risk Table to determine the patient's CHD Risk.        ATP III CLASSIFICATION (LDL):  <100     mg/dL   Optimal  621-308  mg/dL   Near or Above                    Optimal  130-159  mg/dL   Borderline  657-846  mg/dL   High  >962     mg/dL   Very High 9/52/8413 2440   HgbA1C  Lab Results  Component Value Date   HGBA1C  Value: 7.5 (NOTE)                                                                       According to the ADA Clinical Practice Recommendations for 2011, when HbA1c is used as a screening test:   >=6.5%   Diagnostic of Diabetes Mellitus           (if abnormal result  is confirmed)  5.7-6.4%   Increased risk of developing Diabetes Mellitus  References:Diagnosis and Classification of Diabetes Mellitus,Diabetes Care,2011,34(Suppl 1):S62-S69 and Standards of Medical Care in         Diabetes - 2011,Diabetes Care,2011,34  (Suppl 1):S11-S61.* 08/19/2010   Urine Drug Screen  No results found for this basename: labopia, cocainscrnur, labbenz, amphetmu, thcu, labbarb    Alcohol Level No results found for this basename: eth    CT of the brain   No evidence of acute intracranial hemorrhage, mass lesion, or acute infarct.  Mild inflammatory changes in the paranasal sinuses.    MRI of the brain  Three diffusion sequences were obtained.  It is difficult to confirm with any certainty however, on these sequences, the possibility of tiny acute  bilateral occipital lobe and posterior left frontal lobe infarcts is raised.  If these represent true findings, embolic disease is a consideration.  No intracranial hemorrhage.  Scattered white matter type changes consistent with result of small vessel disease.  Mild atrophy without hydrocephalus.  Paranasal sinus opacification most notable right maxillary sinus, right sphenoid sinus and the right frontal sinus.     MRA of the brain  Mild intracranial atherosclerotic type changes   2D Echocardiogram  ordered  Carotid Doppler  ordered   CXR  No  evidence of active pulmonary disease.   EKG  normal sinus rhythm, RSR'  EEG    Physical Exam   Pleasant middle-aged Caucasian male currently not in distress. Afebrile. Head is not traumatic. Neck is supple without bruit. Hearing is normal. Cardiac exam the murmur gallop. Lungs clear to auscultation. Neurological exam Awake  Alert oriented x 3. Normal speech and language.eye movements full without nystagmus. Face symmetric. Tongue midline. Normal strength, tone, reflexes and coordination. Normal sensation. Gait deferred.  ASSESSMENT Kirk Rosario is a 72 y.o. male with confusion and headache following a cardiac cath. DWI abnormalities are. subtle but probably likley small post catheterization embolic infarcts . On aspirin 325 mg orally every day for secondary stroke prevention.  Hospital day # 2  TREATMENT/PLAN Continue aspirin 325 mg orally every day for secondary stroke prevention. Check 2 D and carotid doppler.  Joaquin Music, ANP-BC, GNP-BC [facility] Pager: [phone] 06/23/2011 10:33 AM  Dr. Delia Heady, Stroke Center Medical Director, has personally reviewed chart, pertinent data, examined the patient and developed the plan of care.

## 2011-06-24 ENCOUNTER — Encounter (HOSPITAL_COMMUNITY): Payer: Self-pay | Admitting: Cardiology

## 2011-06-24 DIAGNOSIS — I639 Cerebral infarction, unspecified: Secondary | ICD-10-CM

## 2011-06-24 DIAGNOSIS — Z8673 Personal history of transient ischemic attack (TIA), and cerebral infarction without residual deficits: Secondary | ICD-10-CM | POA: Diagnosis present

## 2011-06-24 HISTORY — DX: Cerebral infarction, unspecified: I63.9

## 2011-06-24 LAB — GLUCOSE, CAPILLARY
Glucose-Capillary: 141 mg/dL — ABNORMAL HIGH (ref 70–99)
Glucose-Capillary: 143 mg/dL — ABNORMAL HIGH (ref 70–99)

## 2011-06-24 MED ORDER — METOPROLOL TARTRATE 12.5 MG HALF TABLET
12.5000 mg | ORAL_TABLET | Freq: Two times a day (BID) | ORAL | Status: DC
Start: 2011-06-24 — End: 2011-06-25
  Administered 2011-06-24 – 2011-06-25 (×3): 12.5 mg via ORAL
  Filled 2011-06-24 (×4): qty 1

## 2011-06-24 NOTE — Progress Notes (Signed)
Subjective: No chest pain, confused.  Complaining about timing of meds.  Objective: Vital signs in last 24 hours: Temp:  [97.9 F (36.6 C)-98 F (36.7 C)] 98 F (36.7 C) (02/01 2145) Pulse Rate:  [58-94] 58  (02/01 2145) Resp:  [18] 18  (02/01 2145) BP: (125-133)/(64-70) 133/70 mmHg (02/01 2145) SpO2:  [96 %-97 %] 97 % (02/01 2145) Weight change:  Last BM Date: 06/22/11 Intake/Output from previous day:+4540 02/01 0701 - 02/02 0700 In: 480 [P.O.:480] Out: -  Intake/Output this shift:    PE: General:Oriented , but easily aggitated Heart:S1S2, RRR. No murmur, gallup rub or click Lungs:clear Abd:+BS, soft, non-tender Ext:no edema Neuro:A&O X 3, MAE, follows commands, becomes agitated easily, RN reports occ. Confusion.   Lab Results:  Basename 06/23/11 0544 06/22/11 0845  WBC 7.3 8.5  HGB 14.3 14.9  HCT 41.4 42.6  PLT 170 174   BMET  Basename 06/23/11 0544 06/22/11 0845  NA 140 139  K 3.6 3.6  CL 107 105  CO2 21 23  GLUCOSE 141* 153*  BUN 10 11  CREATININE 0.95 0.88  CALCIUM 8.6 8.7    Basename 06/21/11 2112  TROPONINI <0.30    Lab Results  Component Value Date   CHOL  Value: 129        ATP III CLASSIFICATION:  <200     mg/dL   Desirable  161-096  mg/dL   Borderline High  >=045    mg/dL   High        08/28/8117   HDL 31* 08/20/2010   LDLCALC  Value: 66        Total Cholesterol/HDL:CHD Risk Coronary Heart Disease Risk Table                     Men   Women  1/2 Average Risk   3.4   3.3  Average Risk       5.0   4.4  2 X Average Risk   9.6   7.1  3 X Average Risk  23.4   11.0        Use the calculated Patient Ratio above and the CHD Risk Table to determine the patient's CHD Risk.        ATP III CLASSIFICATION (LDL):  <100     mg/dL   Optimal  147-829  mg/dL   Near or Above                    Optimal  130-159  mg/dL   Borderline  562-130  mg/dL   High  >865     mg/dL   Very High 7/84/6962   TRIG 158* 08/20/2010   CHOLHDL 4.2 08/20/2010   Lab Results  Component Value  Date   HGBA1C  Value: 7.5 (NOTE)                                                                       According to the ADA Clinical Practice Recommendations for 2011, when HbA1c is used as a screening test:   >=6.5%   Diagnostic of Diabetes Mellitus           (if abnormal result  is confirmed)  5.7-6.4%   Increased risk of  developing Diabetes Mellitus  References:Diagnosis and Classification of Diabetes Mellitus,Diabetes Care,2011,34(Suppl 1):S62-S69 and Standards of Medical Care in         Diabetes - 2011,Diabetes Care,2011,34  (Suppl 1):S11-S61.* 08/19/2010     Lab Results  Component Value Date   TSH 1.991 06/22/2011    Hepatic Function Panel  Basename 06/22/11 0845  PROT 6.1  ALBUMIN 3.4*  AST 33  ALT 24  ALKPHOS 98  BILITOT 0.8  BILIDIR --  IBILI --   No results found for this basename: CHOL in the last 72 hours No results found for this basename: PROTIME in the last 72 hours    EKG: Orders placed during the hospital encounter of 06/21/11  . ED EKG  . ED EKG  . EKG    Studies/Results: Mr Shirlee Latch WU Contrast  06/23/2011  *RADIOLOGY REPORT*  Clinical Data:  Confusion and slurred speech after catheterization 2 days ago.  MRI HEAD WITHOUT CONTRAST MRA HEAD WITHOUT CONTRAST  Technique: Multiplanar, multiecho pulse sequences of the brain and surrounding structures were obtained according to standard protocol without intravenous contrast.  Angiographic images of the head were obtained using MRA technique without contrast.  Comparison: 06/21/2011 head CT.  11/27/2009 MR brain.  MRI HEAD  Findings:  Three diffusion sequences were obtained.  It is difficult to confirm with any certainty however, on these sequences, the possibility of tiny acute bilateral occipital lobe and posterior left frontal lobe infarcts is raised.  If these represent true findings, embolic disease is a consideration.  No intracranial hemorrhage.  Scattered white matter type changes consistent with result of small  vessel disease.  Mild atrophy without hydrocephalus.  No intracranial mass lesion detected on this unenhanced slightly motion degraded exam.  Small sella/pituitary gland unchanged.  Cervical medullary junction, pineal region and orbital structures unremarkable.  Paranasal sinus opacification most notable right maxillary sinus, right sphenoid sinus and the right frontal sinus.  Major intracranial vascular structures are patent.  IMPRESSION: Three diffusion sequences were obtained.  It is difficult to confirm with any certainty however, on these sequences, the possibility of tiny acute bilateral occipital lobe and posterior left frontal lobe infarcts is raised.  If these represent true findings, embolic disease is a consideration.  No intracranial hemorrhage.  Scattered white matter type changes consistent with result of small vessel disease.  Mild atrophy without hydrocephalus.  Paranasal sinus opacification most notable right maxillary sinus, right sphenoid sinus and the right frontal sinus.  MRA HEAD  Findings: Mild supraclinoid internal carotid artery narrowing greater on the left.  Mild irregularity left anterior cerebral artery.  Middle cerebral artery branch vessel irregularity.  Mild ectasia and irregularity of the vertebral arteries bilaterally.  Ectatic left PICA without draining vein to suggest arteriovenous malformation.  Ectatic basilar artery without high-grade stenosis.  Nonvisualization left AICA.  Moderate narrowing proximal right superior cerebellar artery.  Mild irregularity proximal right posterior cerebral artery.  Mild posterior cerebral artery distal branch vessel irregularity bilaterally.  No discrete aneurysm noted on this slightly motion degraded exam.  IMPRESSION: Mild intracranial atherosclerotic type changes as detailed above.  Original Report Authenticated By: Fuller Canada, M.D.   Mr Brain Wo Contrast  06/23/2011  *RADIOLOGY REPORT*  Clinical Data:  Confusion and slurred speech after  catheterization 2 days ago.  MRI HEAD WITHOUT CONTRAST MRA HEAD WITHOUT CONTRAST  Technique: Multiplanar, multiecho pulse sequences of the brain and surrounding structures were obtained according to standard protocol without intravenous contrast.  Angiographic images of the  head were obtained using MRA technique without contrast.  Comparison: 06/21/2011 head CT.  11/27/2009 MR brain.  MRI HEAD  Findings:  Three diffusion sequences were obtained.  It is difficult to confirm with any certainty however, on these sequences, the possibility of tiny acute bilateral occipital lobe and posterior left frontal lobe infarcts is raised.  If these represent true findings, embolic disease is a consideration.  No intracranial hemorrhage.  Scattered white matter type changes consistent with result of small vessel disease.  Mild atrophy without hydrocephalus.  No intracranial mass lesion detected on this unenhanced slightly motion degraded exam.  Small sella/pituitary gland unchanged.  Cervical medullary junction, pineal region and orbital structures unremarkable.  Paranasal sinus opacification most notable right maxillary sinus, right sphenoid sinus and the right frontal sinus.  Major intracranial vascular structures are patent.  IMPRESSION: Three diffusion sequences were obtained.  It is difficult to confirm with any certainty however, on these sequences, the possibility of tiny acute bilateral occipital lobe and posterior left frontal lobe infarcts is raised.  If these represent true findings, embolic disease is a consideration.  No intracranial hemorrhage.  Scattered white matter type changes consistent with result of small vessel disease.  Mild atrophy without hydrocephalus.  Paranasal sinus opacification most notable right maxillary sinus, right sphenoid sinus and the right frontal sinus.  MRA HEAD  Findings: Mild supraclinoid internal carotid artery narrowing greater on the left.  Mild irregularity left anterior cerebral  artery.  Middle cerebral artery branch vessel irregularity.  Mild ectasia and irregularity of the vertebral arteries bilaterally.  Ectatic left PICA without draining vein to suggest arteriovenous malformation.  Ectatic basilar artery without high-grade stenosis.  Nonvisualization left AICA.  Moderate narrowing proximal right superior cerebellar artery.  Mild irregularity proximal right posterior cerebral artery.  Mild posterior cerebral artery distal branch vessel irregularity bilaterally.  No discrete aneurysm noted on this slightly motion degraded exam.  IMPRESSION: Mild intracranial atherosclerotic type changes as detailed above.  Original Report Authenticated By: Fuller Canada, M.D.    Medications: I have reviewed the patient's current medications.    Marland Kitchen aspirin  325 mg Oral Daily  . carbidopa-levodopa  1 tablet Oral TID WC  . insulin aspart  0-15 Units Subcutaneous TID WC  . insulin aspart  0-5 Units Subcutaneous QHS  . metFORMIN  500 mg Oral Q breakfast  . pantoprazole  40 mg Oral Daily  . PARoxetine  20 mg Oral Daily  . rOPINIRole  1.5 mg Oral TID  . simvastatin  20 mg Oral q1800  . trihexyphenidyl  2 mg Oral BID   Assessment/Plan: Patient Active Problem List  Diagnoses  . Chest pain at rest , negative troponins, patent coronary arteries 1999 and neg. stress myoview 08/2010  . DM (diabetes mellitus)  . Parkinson's disease  . Hyperlipidemia  . Confusion, postoperative,altered mental status  . CVA (cerebral infarction), 06/21/11   PLAN:no carotid disease, 2D Echo not yet done.  See neuro note. VS stable.   LOS: 3 days   INGOLD,LAURA R 06/24/2011, 8:11 AM     Patient seen and examined. Agree with assessment and plan.  Medical therapy for CAD noted at cath. Will add low dose beta-blocker with lopressor 12.5 mg bid.   Lennette Bihari, MD, [facility] 06/24/2011 8:42 AM

## 2011-06-24 NOTE — Progress Notes (Signed)
*  PRELIMINARY RESULTS* Echocardiogram 2D Echocardiogram has been performed.  Clide Deutscher 06/24/2011, 12:26 PM

## 2011-06-24 NOTE — Progress Notes (Signed)
Stroke Team Progress Note  HISTORY KYI Kirk Rosario is an 71 y.o. male white presenting in the ER with confusion and headache. He is currently not answering questions appropriately so most of the history is from his wife. The patient underwent a heart catherization 06/21/2011 around 5 pm. Afterwards he had a headache. When the patient arrived home, he became confused and told his wife he felt like he was going to die. He refused a call to 911 so his wife went to the neighbor's and asked them to come help. The patient didn't recognize his neighbor's and at that point they decided to call EMS. The patient has undergone a heart cath before without similar symptoms afterwards. No stents were placed today but the procedure was done via his right arm as the patient has restless leg syndrome and is unable to keep his legs still.  He had an aspirin this morning and another aspiring after the procedure. He continues to complain of a headache but further history is unobtainable.   SUBJECTIVE His wife is at the bedside. The patient has no complaints at this time, and is upset that he cannot go home right now.   OBJECTIVE Filed Vitals:   06/23/11 0409 06/23/11 1415 06/23/11 2145 06/24/11 1341  BP: 148/71 125/64 133/70 134/71  Pulse: 67 94 58 58  Temp: 98.5 F (36.9 C) 97.9 F (36.6 C) 98 F (36.7 C) 98.3 F (36.8 C)  TempSrc: Oral Oral Oral Oral  Resp: 20 18 18 18   Height:      Weight:      SpO2: 97% 96% 97% 97%   CBG (last 3)   Basename 06/24/11 1143 06/24/11 0606 06/23/11 2139  GLUCAP 141* 131* 148*   Intake/Output from previous day: 02/01 0701 - 02/02 0700 In: 480 [P.O.:480] Out: -   IV Fluid Intake:      . DISCONTD: sodium chloride 125 mL/hr at 06/23/11 1548   Medications     . aspirin  325 mg Oral Daily  . carbidopa-levodopa  1 tablet Oral TID WC  . insulin aspart  0-15 Units Subcutaneous TID WC  . insulin aspart  0-5 Units Subcutaneous QHS  . metFORMIN  500 mg Oral Q breakfast   . metoprolol tartrate  12.5 mg Oral BID  . pantoprazole  40 mg Oral Daily  . PARoxetine  20 mg Oral Daily  . rOPINIRole  1.5 mg Oral TID  . simvastatin  20 mg Oral q1800  . trihexyphenidyl  2 mg Oral BID  PRN haloperidol lactate, LORazepam, ondansetron (ZOFRAN) IV  Diet:  Cardiac thin liquids Activity:  Bedrest  DVT Prophylaxis:    Significant Diagnostic Studies: CBC    Component Value Date/Time   WBC 7.3 06/23/2011 0544   RBC 4.84 06/23/2011 0544   HGB 14.3 06/23/2011 0544   HCT 41.4 06/23/2011 0544   PLT 170 06/23/2011 0544   MCV 85.5 06/23/2011 0544   MCH 29.5 06/23/2011 0544   MCHC 34.5 06/23/2011 0544   RDW 13.0 06/23/2011 0544   LYMPHSABS 1.0 06/21/2011 2111   MONOABS 0.9 06/21/2011 2111   EOSABS 0.0 06/21/2011 2111   BASOSABS 0.0 06/21/2011 2111   CMP    Component Value Date/Time   NA 140 06/23/2011 0544   K 3.6 06/23/2011 0544   CL 107 06/23/2011 0544   CO2 21 06/23/2011 0544   GLUCOSE 141* 06/23/2011 0544   BUN 10 06/23/2011 0544   CREATININE 0.95 06/23/2011 0544   CALCIUM 8.6 06/23/2011 0544  PROT 6.1 06/22/2011 0845   ALBUMIN 3.4* 06/22/2011 0845   AST 33 06/22/2011 0845   ALT 24 06/22/2011 0845   ALKPHOS 98 06/22/2011 0845   BILITOT 0.8 06/22/2011 0845   GFRNONAA 84* 06/23/2011 0544   GFRAA >90 06/23/2011 0544   COAGS Lab Results  Component Value Date   INR 1.05 08/19/2010   Lipid Panel    Component Value Date/Time   CHOL  Value: 129        ATP III CLASSIFICATION:  <200     mg/dL   Desirable  161-096  mg/dL   Borderline High  >=045    mg/dL   High        08/28/8117 0505   TRIG 158* 08/20/2010 0505   HDL 31* 08/20/2010 0505   CHOLHDL 4.2 08/20/2010 0505   VLDL 32 08/20/2010 0505   LDLCALC  Value: 66        Total Cholesterol/HDL:CHD Risk Coronary Heart Disease Risk Table                     Men   Women  1/2 Average Risk   3.4   3.3  Average Risk       5.0   4.4  2 X Average Risk   9.6   7.1  3 X Average Risk  23.4   11.0        Use the calculated Patient Ratio above and the CHD Risk Table to  determine the patient's CHD Risk.        ATP III CLASSIFICATION (LDL):  <100     mg/dL   Optimal  147-829  mg/dL   Near or Above                    Optimal  130-159  mg/dL   Borderline  562-130  mg/dL   High  >865     mg/dL   Very High 7/84/6962 9528   HgbA1C  Lab Results  Component Value Date   HGBA1C  Value: 7.5 (NOTE)                                                                       According to the ADA Clinical Practice Recommendations for 2011, when HbA1c is used as a screening test:   >=6.5%   Diagnostic of Diabetes Mellitus           (if abnormal result  is confirmed)  5.7-6.4%   Increased risk of developing Diabetes Mellitus  References:Diagnosis and Classification of Diabetes Mellitus,Diabetes Care,2011,34(Suppl 1):S62-S69 and Standards of Medical Care in         Diabetes - 2011,Diabetes Care,2011,34  (Suppl 1):S11-S61.* 08/19/2010   Urine Drug Screen  No results found for this basename: labopia,  cocainscrnur,  labbenz,  amphetmu,  thcu,  labbarb    Alcohol Level No results found for this basename: eth    CT of the brain   No evidence of acute intracranial hemorrhage, mass lesion, or acute infarct.  Mild inflammatory changes in the paranasal sinuses.    MRI of the brain  Three diffusion sequences were obtained.  It is difficult to confirm with any certainty however, on these sequences, the possibility of tiny  acute bilateral occipital lobe and posterior left frontal lobe infarcts is raised.  If these represent true findings, embolic disease is a consideration.  No intracranial hemorrhage.  Scattered white matter type changes consistent with result of small vessel disease.  Mild atrophy without hydrocephalus.  Paranasal sinus opacification most notable right maxillary sinus, right sphenoid sinus and the right frontal sinus.     MRA of the brain  Mild intracranial atherosclerotic type changes   2D Echocardiogram  done, results pending.  Carotid Doppler    Vascular Ultrasound    Carotid Duplex (Doppler) has been completed. Preliminary findings: Bilaterally no significant ICA stenosis with antegrade vertebral flow.   CXR  No evidence of active pulmonary disease.   EKG  normal sinus rhythm, RSR'  EEG    Physical Exam   The patient is alert and cooperative at the time of the examination.  Extraocular movements are full, visual fields are full.  Patient has good strength of all 4 extremities.  Patient has good finger-nose-finger and heel-to-shin bilaterally. Gait was not tested.  Deep tendon reflexes are depressed but symmetric.  Speech is normal, no aphasia or dysarthria is noted.   ASSESSMENT Kirk Rosario is a 71 y.o. male with confusion and headache following a cardiac cath. DWI abnormalities are. subtle but probably likley small post catheterization embolic infarcts . On aspirin 325 mg orally every day for secondary stroke prevention.  Hospital day # 3  TREATMENT/PLAN Continue aspirin 325 mg orally every day for secondary stroke prevention. Check 2 D echo, study has been done, results are pending. The patient feels well, and wants to go home at this time.  Lesly Dukes

## 2011-06-24 NOTE — Progress Notes (Signed)
Patient ambulated 519ft independently without any complications.  Patient tolerated well.  Returned to bed.  VSS.  Will continue to monitor.

## 2011-06-25 ENCOUNTER — Encounter (HOSPITAL_COMMUNITY): Payer: Self-pay | Admitting: Cardiology

## 2011-06-25 DIAGNOSIS — I2510 Atherosclerotic heart disease of native coronary artery without angina pectoris: Secondary | ICD-10-CM

## 2011-06-25 HISTORY — DX: Atherosclerotic heart disease of native coronary artery without angina pectoris: I25.10

## 2011-06-25 LAB — GLUCOSE, CAPILLARY

## 2011-06-25 MED ORDER — METOPROLOL TARTRATE 25 MG PO TABS: ORAL_TABLET | ORAL | Status: DC

## 2011-06-25 NOTE — Discharge Summary (Signed)
Physician Discharge Summary  Patient ID: Kirk Rosario MRN: [id number] DOB/AGE: [birth year] 71 y.o.  Admit date: 06/21/2011 Discharge date: 06/25/2011  Discharge Diagnoses:  Principal Problem:  *Confusion, postoperative,altered mental status Active Problems:  CVA (cerebral infarction), 06/21/11  Parkinson's disease  Hyperlipidemia  CAD (coronary artery disease)   Discharged Condition: good  Hospital Course: 71 year old white married male presented to the emergency room 06/21/2011 secondary to altered mental status. He also complained of severe headache. Earlier that day he had undergone a left heart cath secondary to unstable angina. He had coronary disease but it was nonobstructed, and no interventions were called needed. After arriving at home he had severe headache associated photophobia or progress to altered mental status with confusion, confabulation and disorientation. Did not complain of chest pain but did have nausea.  He was seen and evaluated in the emergency room and neuro consult was obtained. MRI of the brain with DWI and MRA was ordered.   CT of the head without contrast was done initially with impression:  No evidence of acute intracranial hemorrhage, mass lesion, or acute  infarct. Mild inflammatory changes in the paranasal sinuses  MRI, MRA of the head: Mild intracranial atherosclerotic type changes as detailed above.  Three diffusion sequences were obtained. It is difficult to  confirm with any certainty however, on these sequences, the  possibility of tiny acute bilateral occipital lobe and posterior  left frontal lobe infarcts is raised. If these represent true  findings, embolic disease is a consideration.  No intracranial hemorrhage.  Scattered white matter type changes consistent with result of small  vessel disease.  Mild atrophy without hydrocephalus.  Paranasal sinus opacification most notable right maxillary sinus,  right sphenoid sinus and the right  frontal sinus.  He was admitted and monitored. His symptoms improved. He was evaluated by speech and had no swallowing dysfunction. He was evaluated by physical therapy and they felt he was stable and needed no further evaluation or treatment.  Neurology recommended aspirin for secondary stroke prevention.  It was felt the tibia I abnormalities are subtle but probably likely small post catheterization embolic infarctions.  He was stable and ready for discharge home he was seen and evaluated by Dr. Tresa Endo and was discharged.  Consults: neurology  Significant Diagnostic Studies: 2-D echo: Left ventricle: The cavity size was normal. Systolic function was normal. The estimated ejection fraction was in the range of 55% to 60%. Wall motion was normal; there were no regional wall motion abnormalities. The transmitral flow pattern was normal. The deceleration time of the early transmitral flow velocity was normal. The pulmonary vein flow pattern was normal. The tissue Doppler parameters were normal. Left ventricular diastolic function parameters were normal.  ------------------------------------------------------------ Aortic valve: Trileaflet; mildly thickened leaflets. Mobility was not restricted. Doppler: Transvalvular velocity was within the normal range. There was no stenosis. No regurgitation.  ------------------------------------------------------------ Aorta: Mild non ulcerated plaque is seen in the aortic arch. Aortic root: The aortic root was normal in size.  ------------------------------------------------------------ Mitral valve: Structurally normal valve. Mobility was not restricted. Doppler: Transvalvular velocity was within the normal range. There was no evidence for stenosis. No regurgitation.  ------------------------------------------------------------ Left atrium: The atrium was normal in size Right ventricle: The cavity size was normal. Wall thickness was normal.  Systolic function was normal.  ------------------------------------------------------------ Pulmonic valve: Poorly visualized. The valve appears to be grossly normal. Doppler: Transvalvular velocity was within the normal range. There was no evidence for stenosis. Trivial regurgitation.  ------------------------------------------------------------ Tricuspid valve: Structurally normal  valve. Doppler: Transvalvular velocity was within the normal range. No regurgitation.  ------------------------------------------------------------ Pulmonary artery: The main pulmonary artery was normal-sized. Systolic pressure was within the normal range.  ------------------------------------------------------------ Right atrium: The atrium was normal in size.  ------------------------------------------------------------ Pericardium: There was no pericardial effusion.  ------------------------------------------------------------ Systemic veins: Inferior vena cava: The vessel was normal in size.  ------------------------------------------------------------ Post procedure conclusions Ascending Aorta:  - Mild non ulcerated plaque is seen in the aortic arch.  Carotid Dopplers: preliminary findings with no significant ICA stenosis bilaterally and antegrade vertebral flow.  Labs: Sodium 140 potassium 3.6 chloride 107 CO2 21 BUN 10 creatinine 0.95 calcium 8.6 phosphorus 2.5 magnesium 1.9 alkaline phosphatase  98 albumin 3.4 AST 33 ALT 24 total protein 6.1 total bili 0.8  Hemoglobin 14.3 hematocrit 41.4 WBC 7.3 platelets 170  TSH 1.991 EKG: No acute changes.  Discharge Exam: Blood pressure 122/70, pulse 63, temperature 97.4 F (36.3 C), temperature source Oral, resp. rate 18, height 5\' 10"  (1.778 m), weight 102.2 kg (225 lb 5 oz), SpO2 100.00%.   General appearance:Kirk Rosario/at, alert  Neck no bruits  Lungs: {no wheezing  Heart: RRR 1/6 sem  Abdomen: Soft bs+  Extremities: no edema  Disposition: Home  or Self Care  Discharge Orders    Future Appointments: Provider: Department: Dept Phone: Center:   07/10/2011 10:00 AM Eliezer Bottom., MD,FACG [facility] Office [phone] [facility]     Medication List  As of 06/25/2011  5:15 PM   TAKE these medications         aspirin EC 325 MG tablet   Take 325 mg by mouth daily as needed. For pain      carbidopa-levodopa 25-100 MG per tablet   Commonly known as: SINEMET   Take 1 tablet by mouth 3 (three) times daily.      esomeprazole 40 MG capsule   Commonly known as: NEXIUM   Take 40 mg by mouth daily before breakfast.      metFORMIN 500 MG (MOD) 24 hr tablet   Commonly known as: GLUMETZA   Take 500 mg by mouth daily with breakfast.      metoprolol tartrate 25 MG tablet   Commonly known as: LOPRESSOR   25 mg tabs, take half a tablet to equal 12.5 mg twice a day.      PARoxetine 20 MG tablet   Commonly known as: PAXIL   Take 20 mg by mouth daily.      pravastatin 40 MG tablet   Commonly known as: PRAVACHOL   Take 40 mg by mouth every evening.      rOPINIRole 3 MG tablet   Commonly known as: REQUIP   Take 1.5 mg by mouth 3 (three) times daily.      trihexyphenidyl 2 MG tablet   Commonly known as: ARTANE   Take 2 mg by mouth 2 (two) times daily.           Follow-up Information    Follow up with Mickie Hillier, MD. (call for an appointment)       Follow up with Marykay Lex, MD on 07/05/2011. (at noon)    Contact information:   [facility] [address], [address] [place] [address] [phone]        Heart Healthy, Diabetic Diet   Signed: Leone Brand 06/25/2011, 5:15 PM

## 2011-06-25 NOTE — Progress Notes (Signed)
[facility] Progress Note  Subjective:  Ready to go home. No cp or sob.  Objective:   Vital Signs in the last 24 hours: Temp:  [97 F (36.1 C)-98.3 F (36.8 C)] 97.4 F (36.3 C) (02/03 0615) Pulse Rate:  [58-77] 77  (02/03 0615) Resp:  [18-19] 18  (02/03 0615) BP: (105-134)/(61-75) 105/61 mmHg (02/03 0615) SpO2:  [96 %-100 %] 100 % (02/03 0615)  Intake/Output from previous day: 02/02 0701 - 02/03 0700 In: 120 [P.O.:120] Out: -   Scheduled:   . aspirin  325 mg Oral Daily  . carbidopa-levodopa  1 tablet Oral TID WC  . insulin aspart  0-15 Units Subcutaneous TID WC  . insulin aspart  0-5 Units Subcutaneous QHS  . metFORMIN  500 mg Oral Q breakfast  . metoprolol tartrate  12.5 mg Oral BID  . pantoprazole  40 mg Oral Daily  . PARoxetine  20 mg Oral Daily  . rOPINIRole  1.5 mg Oral TID  . simvastatin  20 mg Oral q1800  . trihexyphenidyl  2 mg Oral BID    Physical Exam:   General appearance:/at, alert Neck no bruits Lungs: {no wheezing Heart: RRR 1/6 sem Abdomen:  Soft bs+ Extremities: no edema   Rate: 70  Rhythm:sinus  Lab Results: Results for orders placed during the hospital encounter of 06/21/11 (from the past 24 hour(s))  GLUCOSE, CAPILLARY     Status: Abnormal   Collection Time   06/24/11 11:43 AM      Component Value Range   Glucose-Capillary 141 (*) 70 - 99 (mg/dL)   Comment 1 Documented in Chart     Comment 2 Notify RN    GLUCOSE, CAPILLARY     Status: Abnormal   Collection Time   06/24/11  4:16 PM      Component Value Range   Glucose-Capillary 143 (*) 70 - 99 (mg/dL)  GLUCOSE, CAPILLARY     Status: Abnormal   Collection Time   06/24/11  9:50 PM      Component Value Range   Glucose-Capillary 186 (*) 70 - 99 (mg/dL)  GLUCOSE, CAPILLARY     Status: Abnormal   Collection Time   06/25/11  6:22 AM      Component Value Range   Glucose-Capillary 144 (*) 70 - 99 (mg/dL)    Basename 40/34/74 2595  NA 140  K 3.6  CL 107    CO2 21  GLUCOSE 141*  BUN 10  CREATININE 0.95   No results found for this basename: TROPONINI:2,CK,MB:2 in the last 72 hours Hepatic Function Panel No results found for this basename: PROT,ALBUMIN,AST,ALT,ALKPHOS,BILITOT,BILIDIR,IBILI in the last 72 hours No results found for this basename: INR in the last 72 hours     Imaging:  No results found.  Cardiac Studies:  2D Echocardiogram Study Conclusions  Left ventricle: The cavity size was normal. Systolic function was normal. The estimated ejection fraction was in the range of 55% to 60%. Wall motion was normal; there were no regional wall motion abnormalities. Left ventricular diastolic function parameters were normal. Transthoracic echocardiography.  M-mode, complete 2D, spectral Doppler, and color Doppler.  Height:  Height: 177.8cm. Height: 70in.  Weight:  Weight: 102kg. Weight: 224.4lb.  Body mass index:  BMI: 32.3kg/m^2.  Body surface area:    BSA: 2.46m^2.  Blood pressure:     133/70.  Patient status:  Inpatient.  Location:  Bedside.  ------------------------------------------------------------  ------------------------------------------------------------ Left ventricle:  The cavity size was normal. Systolic function  was normal. The estimated ejection fraction was in the range of 55% to 60%. Wall motion was normal; there were no regional wall motion abnormalities. The transmitral flow pattern was normal. The deceleration time of the early transmitral flow velocity was normal. The pulmonary vein flow pattern was normal. The tissue Doppler parameters were normal. Left ventricular diastolic function parameters were normal.  ------------------------------------------------------------ Aortic valve:   Trileaflet; mildly thickened leaflets. Mobility was not restricted.  Doppler:  Transvalvular velocity was within the normal range. There was no stenosis.  No  regurgitation.  ------------------------------------------------------------ Aorta:  Mild non ulcerated plaque is seen in the aortic arch. Aortic root: The aortic root was normal in size.  ------------------------------------------------------------ Mitral valve:   Structurally normal valve.   Mobility was not restricted.  Doppler:  Transvalvular velocity was within the normal range. There was no evidence for stenosis.  No regurgitation.  ------------------------------------------------------------ Left atrium:  The atrium was normal in size.  ------------------------------------------------------------ Right ventricle:  The cavity size was normal. Wall thickness was normal. Systolic function was normal.  ------------------------------------------------------------ Pulmonic valve:   Poorly visualized.  The valve appears to be grossly normal.    Doppler:  Transvalvular velocity was within the normal range. There was no evidence for stenosis.  Trivial regurgitation.  ------------------------------------------------------------ Tricuspid valve:   Structurally normal valve.    Doppler: Transvalvular velocity was within the normal range.  No regurgitation.  ------------------------------------------------------------ Pulmonary artery:   The main pulmonary artery was normal-sized. Systolic pressure was within the normal range.   ------------------------------------------------------------ Right atrium:  The atrium was normal in size.  ------------------------------------------------------------ Pericardium:  There was no pericardial effusion.  ------------------------------------------------------------ Systemic veins: Inferior vena cava: The vessel was normal in size.  ------------------------------------------------------------ Post procedure conclusions Ascending Aorta:  - Mild non ulcerated plaque is seen in the aortic arch.     Assessment/Plan:   Principal  Problem:  *Confusion, postoperative,altered mental status Active Problems:  Parkinson's disease  Hyperlipidemia  CVA (cerebral infarction), 06/21/11  For D/C today. ASA for secondary stroke prevention. Mild plaque in the aorta.  Medical therapy for CAD. F/U Dr. Herbie Baltimore.    Lennette Bihari, MD, [facility] 06/25/2011, 9:29 AM

## 2011-06-25 NOTE — Progress Notes (Signed)
Physical Therapy Evaluation Patient Details Name: Kirk Rosario MRN: [id number] DOB: [birth year] Today's Date: 06/25/2011  Problem List:  Patient Active Problem List  Diagnoses  . Chest pain at rest , negative troponins, patent coronary arteries 1999 and neg. stress myoview 08/2010  . DM (diabetes mellitus)  . Parkinson's disease  . Hyperlipidemia  . Confusion, postoperative,altered mental status  . CVA (cerebral infarction), 06/21/11    Past Medical History:  Past Medical History  Diagnosis Date  . Parkinson disease   . Hypercholesteremia   . Chest pain at rest 06/07/2011  . Hernia, hiatal   . Anxiety   . Headache   . Diabetes mellitus   . DM (diabetes mellitus) 06/07/2011  . CVA (cerebral infarction) 06/24/2011   Past Surgical History:  Past Surgical History  Procedure Date  . Hand surgery   . Cardiac catheterization 06/21/2011    PT Assessment/Plan/Recommendation PT Assessment Clinical Impression Statement: 71 yo male with confusion post Cardaic cath, possibly embolic infarcts, presents to PT independent with all mobility including steps, gait velocity changes, and awarenes of self-monitor for balance with Prakinson's; No further PT needs noted PT Recommendation/Assessment: Patent does not need any further PT services No Skilled PT: Patient is modified independent with all activity/mobility PT Recommendation Follow Up Recommendations: No PT follow up Equipment Recommended: None recommended by PT  PT Evaluation Precautions/Restrictions  Restrictions Weight Bearing Restrictions: No Prior Functioning  Home Living Lives With: Spouse Receives Help From: Family Type of Home: House Home Layout: One level Home Access: Stairs to enter Entrance Stairs-Rails: Right Entrance Stairs-Number of Steps: 6 Home Adaptive Equipment: None Prior Function Level of Independence: Independent with homemaking with ambulation Able to Take Stairs?: Yes Driving: Yes Vocation:  Retired Comments: Pt with history of Parkinson's disease; knows his body very well, self monitors for steadiness, starts slowly and "warms up" as he says; good safety awareness Cognition Cognition Arousal/Alertness: Awake/alert Overall Cognitive Status: Appears within functional limits for tasks assessed Orientation Level: Oriented X4 Sensation/Coordination Sensation Light Touch: Appears Intact Coordination Gross Motor Movements are Fluid and Coordinated: No Fine Motor Movements are Fluid and Coordinated: No Coordination and Movement Description: ntoed small tremor, especially clonus-like of LEs in sitting EOB; UEs not tested, but no gross defecits noted Extremity Assessment RUE Assessment RUE Assessment: Within Functional Limits LUE Assessment LUE Assessment: Within Functional Limits RLE Assessment RLE Assessment: Within Functional Limits LLE Assessment LLE Assessment: Within Functional Limits Mobility (including Balance) Bed Mobility Bed Mobility: No Transfers Transfers: Yes Sit to Stand: 6: Modified independent (Device/Increase time) Stand to Sit: 6: Modified independent (Device/Increase time) Ambulation/Gait Ambulation/Gait: Yes Ambulation/Gait Assistance: 6: Modified independent (Device/Increase time) Ambulation Distance (Feet): 500 Feet (greater than 500 ft) Assistive device: None Gait Pattern: Within Functional Limits Gait velocity: WFL, including speed changes; quick stop and 180 deg turn with small loss of balance from which pt recoverd with stepping strategy without physical assist Stairs: Yes Stairs Assistance: 6: Modified independent (Device/Increase time) Stair Management Technique: One rail Right;Forwards;Alternating pattern Number of Stairs: 11      End of Session PT - End of Session Activity Tolerance: Patient tolerated treatment well Patient left: Other (comment) (Up safely and independently in room) General Behavior During Session: [facility] for tasks  performed (Frustrated with timing of Parkinson's meds) Cognition: WFL for tasks performed  Van Clines Hamff 06/25/2011, 9:03 AM

## 2011-07-10 ENCOUNTER — Encounter: Payer: Self-pay | Admitting: Gastroenterology

## 2011-07-10 ENCOUNTER — Ambulatory Visit (INDEPENDENT_AMBULATORY_CARE_PROVIDER_SITE_OTHER): Payer: Medicare Other | Admitting: Gastroenterology

## 2011-07-10 VITALS — BP 134/70 | HR 60 | Ht 70.0 in | Wt 229.8 lb

## 2011-07-10 DIAGNOSIS — R1319 Other dysphagia: Secondary | ICD-10-CM

## 2011-07-10 DIAGNOSIS — K219 Gastro-esophageal reflux disease without esophagitis: Secondary | ICD-10-CM

## 2011-07-10 MED ORDER — OMEPRAZOLE 20 MG PO CPDR: 20.0000 mg | DELAYED_RELEASE_CAPSULE | Freq: Every day | ORAL | Status: DC

## 2011-07-10 NOTE — Patient Instructions (Signed)
You have been scheduled for a Upper Endoscopy Savary dilitation with propofol. See separate instructions.  Your prescription for omeprazole has been sent to the pharmacy.  cc: Catha Gosselin, MD

## 2011-07-10 NOTE — Progress Notes (Signed)
History of Present Illness: This is a 71 year old male relates intermittent difficulties with solid food dysphagia for the past 2 years. He has occasional heartburn and chest pain related to episodes of dysphasia. He has prior history of GERD and esophageal strictures. He was diagnosed with Parkinson's disease in 2011 and had a cerebrovascular in January 2013. I reviewed the records from his recent hospitalization. He states his dysphagia did not worsen with his CVA. Marland KitchenDenies weight loss, abdominal pain, constipation, diarrhea, change in stool caliber, melena, hematochezia, nausea, vomiting, chest pain.  Current Medications, Allergies, Past Medical History, Past Surgical History, Family History and Social History were reviewed in [employer] record.  Physical Exam: General: Well developed , well nourished, no acute distress Head: Normocephalic and atraumatic Eyes:  sclerae anicteric, EOMI Ears: Normal auditory acuity Mouth: No deformity or lesions Lungs: Clear throughout to auscultation Heart: Regular rate and rhythm; no murmurs, rubs or bruits Abdomen: Soft, non tender and non distended. No masses, hepatosplenomegaly or hernias noted. Normal Bowel sounds Musculoskeletal: Symmetrical with no gross deformities  Pulses:  Normal pulses noted Extremities: No clubbing, cyanosis, edema or deformities noted Neurological: Alert oriented x 4, grossly nonfocal Psychological:  Alert and cooperative. Normal mood and affect  Assessment and Recommendations:  1. Dysphagia to solids. 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 Dysphagia may be related to Parkinson's disease. Begin omeprazole 20 mg daily and standard antireflux measures. The risks, benefits, and alternatives to endoscopy with possible biopsy and possible dilation were discussed with the patient and they consent to proceed.   2. Personal history of adenomatous colon polyps. Surveillance colonoscopy recommended August 2015.

## 2011-07-11 ENCOUNTER — Ambulatory Visit (AMBULATORY_SURGERY_CENTER): Payer: Medicare Other | Admitting: Gastroenterology

## 2011-07-11 ENCOUNTER — Encounter: Payer: Self-pay | Admitting: Gastroenterology

## 2011-07-11 DIAGNOSIS — K200 Eosinophilic esophagitis: Secondary | ICD-10-CM

## 2011-07-11 DIAGNOSIS — K219 Gastro-esophageal reflux disease without esophagitis: Secondary | ICD-10-CM

## 2011-07-11 DIAGNOSIS — R1319 Other dysphagia: Secondary | ICD-10-CM

## 2011-07-11 LAB — GLUCOSE, CAPILLARY: Glucose-Capillary: 158 mg/dL — ABNORMAL HIGH (ref 70–99)

## 2011-07-11 MED ORDER — SODIUM CHLORIDE 0.9 % IV SOLN
500.0000 mL | INTRAVENOUS | Status: DC
Start: 2011-07-11 — End: 2011-07-11

## 2011-07-11 NOTE — Op Note (Signed)
[facility] [address] [place], [place]  [address]  ENDOSCOPY PROCEDURE REPORT PATIENT:  Kirk Rosario, Kirk Rosario  MR#:  [id number] BIRTHDATE:  05-11-1941, 70 yrs. old  GENDER:  male ENDOSCOPIST:  Judie Petit T. Russella Dar, MD, [facility]  PROCEDURE DATE:  07/11/2011 PROCEDURE:  EGD with dilatation over guidewire, EGD with biopsy, ASA CLASS:  Class III INDICATIONS:  dysphagia MEDICATIONS:  MAC sedation, administered by CRNA, propofol (Diprivan) 270 mg IV TOPICAL ANESTHETIC:  none DESCRIPTION OF PROCEDURE:   After the risks benefits and alternatives of the procedure were thoroughly explained, informed consent was obtained.  The LB GIF-H180 [id number] endoscope was introduced through the mouth and advanced to the second portion of the duodenum, without limitations.  The instrument was slowly withdrawn as the mucosa was fully examined. <<PROCEDUREIMAGES>> White plaques and exudates in the mid esophagus. Multiple biopsies were obtained and sent to pathology. Savary / guidewire with 15 mm, 16 mm and 17 mm dilators with small amount of heme on each and minimal resistance to 16 mm and 17 mm dilators.  Otherwise normal esophagus.  The stomach was entered and closely examined. The pylorus, antrum, angularis, and lesser curvature were well visualized, including a retroflexed view of the cardia and fundus. The stomach wall was normally distensable. The scope passed easily through the pylorus into the duodenum.  The duodenal bulb was normal in appearance, as was the postbulbar duodenum.  Retroflexed views revealed no abnormalities.  The scope was then withdrawn from the patient and the procedure completed.  COMPLICATIONS:  None  ENDOSCOPIC IMPRESSION: 1) White plaques/exudates in the mid esophagus  RECOMMENDATIONS: 1) Anti-reflux regimen 2) Await pathology results 3) PPI qam 4) Post dilation instructions 5) Call office to schedule an office appointment for 4-6 weeks  Sidrah Harden T. Russella Dar, MD,  Clementeen Graham  n. eSIGNED:   Venita Lick. Laurence Folz at 07/11/2011 08:53 AM  Ladean Raya, [phone]

## 2011-07-11 NOTE — Progress Notes (Signed)
Patient did not experience any of the following events: a burn prior to discharge; a fall within the facility; wrong site/side/patient/procedure/implant event; or a hospital transfer or hospital admission upon discharge from the facility. (G8907) Patient did not have preoperative order for IV antibiotic SSI prophylaxis. (G8918)  

## 2011-07-11 NOTE — Patient Instructions (Signed)
YOU HAD AN ENDOSCOPIC PROCEDURE TODAY AT THE [facility]: Refer to the procedure report that was given to you for any specific questions about what was found during the examination.  If the procedure report does not answer your questions, please call your gastroenterologist to clarify.  If you requested that your care partner not be given the details of your procedure findings, then the procedure report has been included in a sealed envelope for you to review at your convenience later.  YOU SHOULD EXPECT: Some feelings of bloating in the abdomen. Passage of more gas than usual.  Walking can help get rid of the air that was put into your GI tract during the procedure and reduce the bloating. If you had a lower endoscopy (such as a colonoscopy or flexible sigmoidoscopy) you may notice spotting of blood in your stool or on the toilet paper. If you underwent a bowel prep for your procedure, then you may not have a normal bowel movement for a few days.  DIET: Your first meal following the procedure should be a light meal and then it is ok to progress to your normal diet.  A half-sandwich or bowl of soup is an example of a good first meal.  Heavy or fried foods are harder to digest and may make you feel nauseous or bloated.  Likewise meals heavy in dairy and vegetables can cause extra gas to form and this can also increase the bloating.  Drink plenty of fluids but you should avoid alcoholic beverages for 24 hours.  PLEASE FOLLOW THE ESOPHAGEAL DILATION DIET GIVEN TO YOU:  -NOTHING BY MOUTH UNTIL 10:00AM  -CLEAR LIQUIDS FOR 1 HOUR 10:00AM-11:00AM  -SOFT FOODS UNTIL FOR THE REST OF TODAY 11:00AM-TOMORROW  ACTIVITY: Your care partner should take you home directly after the procedure.  You should plan to take it easy, moving slowly for the rest of the day.  You can resume normal activity the day after the procedure however you should NOT DRIVE or use heavy machinery for 24 hours (because of the  sedation medicines used during the test).    SYMPTOMS TO REPORT IMMEDIATELY: A gastroenterologist can be reached at any hour.  During normal business hours, 8:30 AM to 5:00 PM Monday through Friday, call [phone].  After hours and on weekends, please call the GI answering service at [phone] who will take a message and have the physician on call contact you.   Following upper endoscopy (EGD)  Vomiting of blood or coffee ground material  New chest pain or pain under the shoulder blades  Painful or persistently difficult swallowing  New shortness of breath  Fever of 100F or higher  Black, tarry-looking stools  FOLLOW UP: If any biopsies were taken you will be contacted by phone or by letter within the next 1-3 weeks.  Call your gastroenterologist if you have not heard about the biopsies in 3 weeks.  Our staff will call the home number listed on your records the next business day following your procedure to check on you and address any questions or concerns that you may have at that time regarding the information given to you following your procedure. This is a courtesy call and so if there is no answer at the home number and we have not heard from you through the emergency physician on call, we will assume that you have returned to your regular daily activities without incident.  SIGNATURES/CONFIDENTIALITY: You and/or your care partner have signed paperwork which will  be entered into your electronic medical record.  These signatures attest to the fact that that the information above on your After Visit Summary has been reviewed and is understood.  Full responsibility of the confidentiality of this discharge information lies with you and/or your care-partner.

## 2011-07-12 ENCOUNTER — Telehealth: Payer: Self-pay | Admitting: *Deleted

## 2011-07-12 NOTE — Telephone Encounter (Signed)
Left message on ph# left to call for f/u. As ok'd on admission day of procedure.

## 2011-07-17 ENCOUNTER — Encounter: Payer: Self-pay | Admitting: Gastroenterology

## 2011-08-15 ENCOUNTER — Encounter: Payer: Self-pay | Admitting: Gastroenterology

## 2011-08-15 ENCOUNTER — Ambulatory Visit (INDEPENDENT_AMBULATORY_CARE_PROVIDER_SITE_OTHER): Payer: Medicare Other | Admitting: Gastroenterology

## 2011-08-15 VITALS — BP 132/78 | HR 68 | Ht 70.0 in | Wt 233.0 lb

## 2011-08-15 DIAGNOSIS — K200 Eosinophilic esophagitis: Secondary | ICD-10-CM

## 2011-08-15 DIAGNOSIS — R1319 Other dysphagia: Secondary | ICD-10-CM

## 2011-08-15 MED ORDER — AMBULATORY NON FORMULARY MEDICATION: Status: DC

## 2011-08-15 NOTE — Patient Instructions (Addendum)
We have sent the following medications to [facility] on [address] for you to pick up at your convenience:Budesonide 2 mg/ 10mL to take twice daily. cc: Catha Gosselin, MD

## 2011-08-15 NOTE — Progress Notes (Signed)
History of Present Illness: This is a 71 year old male here today with his wife. He notes some modest improvement in his dysphagia this still persists. He has persistent chest pain that appears to worsen after meals and in the morning. Esophageal biopsies and endoscopic appearance was consistent with eosinophilic esophagitis.  Current Medications, Allergies, Past Medical History, Past Surgical History, Family History and Social History were reviewed in [employer] record.  Physical Exam: General: Well developed , well nourished, no acute distress Head: Normocephalic and atraumatic Eyes:  sclerae anicteric, EOMI Ears: Normal auditory acuity Mouth: No deformity or lesions Lungs: Clear throughout to auscultation Heart: Regular rate and rhythm; no murmurs, rubs or bruits Abdomen: Soft, non tender and non distended. No masses, hepatosplenomegaly or hernias noted. Normal Bowel sounds Musculoskeletal: Symmetrical with no gross deformities  Extremities: No clubbing, cyanosis, edema or deformities noted Neurological: Alert oriented x 4, grossly nonfocal Psychological:  Alert and cooperative. Normal mood and affect  Assessment and Recommendations:  1. Eosinophilic esophagitis. We discussed the disease and its treatment and I attempted to answer all their questions. Budesonide liquid twice a day. Return office visit in 8 weeks.

## 2011-08-29 ENCOUNTER — Telehealth: Payer: Self-pay | Admitting: Gastroenterology

## 2011-08-29 NOTE — Telephone Encounter (Signed)
Went over how to take Budesonide twice daily for 8 weeks. Pt states he doesn't want to take any more medicines now. Told him that we recommend that he takes this medication to treat his disease. Pt states he doesn't even know why he is taking this medicine. Told him that Dr. Russella Dar went over the disease and answered all of his questions when he was in the office last. Pt states he "yea ok". Pt states the medicine is not covered and is 60 dollars a month. I offered to switch patient to generic Flovent to treat EE. Pt states he will talk with his wife and decide if he going to continue the medication. Told him that it is up to him but this is what we recommend. Pt agreed.

## 2011-09-07 ENCOUNTER — Telehealth: Payer: Self-pay | Admitting: Gastroenterology

## 2011-09-07 NOTE — Telephone Encounter (Signed)
Patients asked does he really need to take this medication and what is it treating. Told him to discuss this with his wife but I told him at the last conversation we had the diagnosis and treatment. Pt states he doesn't remember. Told him to have his wife call me if there are any more questions about the disease and treatment. Told him he does have to continue budesonide to treat his disease. Pt agreed and verbalized understanding.

## 2011-09-27 ENCOUNTER — Encounter: Payer: Self-pay | Admitting: Gastroenterology

## 2011-10-05 ENCOUNTER — Telehealth: Payer: Self-pay | Admitting: Gastroenterology

## 2011-10-05 NOTE — Telephone Encounter (Signed)
Patient has a follow up with Dr Russella Dar he was to take bedesonide for 8 weeks. The med cost him $60 and he is out he does not want to pay for a refill if Dr Russella Dar is going to stop it next week.  He has a follow up on 5/21 and he has been on it for 8 weeks now. His wife is advised that he can discuss with Dr Russella Dar on Tues and wait on a refill until the appt.

## 2011-10-10 ENCOUNTER — Ambulatory Visit (INDEPENDENT_AMBULATORY_CARE_PROVIDER_SITE_OTHER): Payer: Medicare Other | Admitting: Gastroenterology

## 2011-10-10 ENCOUNTER — Encounter: Payer: Self-pay | Admitting: Gastroenterology

## 2011-10-10 VITALS — BP 120/68 | HR 60 | Ht 70.0 in | Wt 228.2 lb

## 2011-10-10 DIAGNOSIS — R1312 Dysphagia, oropharyngeal phase: Secondary | ICD-10-CM

## 2011-10-10 DIAGNOSIS — K200 Eosinophilic esophagitis: Secondary | ICD-10-CM

## 2011-10-10 NOTE — Progress Notes (Signed)
History of Present Illness: This is a 71 year old male here today with his wife. Complains of difficulty initiating swallows and coughing when swallowing. He also has a cough productive of clear or white mucus. The symptoms have progressively worsened. He does not appear to have any typical reflux symptoms or esophageal dysphagia symptoms. He complains of fatigue.  Current Medications, Allergies, Past Medical History, Past Surgical History, Family History and Social History were reviewed in [employer] record.  Physical Exam: General: Well developed , well nourished, no acute distress Head: Normocephalic and atraumatic Eyes:  sclerae anicteric, EOMI Ears: Normal auditory acuity Mouth: No deformity or lesions Lungs: Clear throughout to auscultation Heart: Regular rate and rhythm; no murmurs, rubs or bruits Abdomen: Soft, non tender and non distended. No masses, hepatosplenomegaly or hernias noted. Normal Bowel sounds Musculoskeletal: Symmetrical with no gross deformities  Pulses:  Normal pulses noted Extremities: No clubbing, cyanosis, edema or deformities noted Neurological: Alert oriented x 4, grossly nonfocal Psychological:  Alert and cooperative. Normal mood and affect  Assessment and Recommendations:  1. Eosinophilic esophagitis. Symptoms well-controlled. Continue budesonide suspension 2 mg twice daily. Continue omeprazole 20 mg daily.  2. 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 Schedule a modified barium swallow study and further evaluation and treatment by his neurologist.  3. Cough. This may be related to problem #2. I asked him to see his primary physician to further evaluate.

## 2011-10-10 NOTE — Patient Instructions (Signed)
You have been scheduled for a modified barium swallow on 10/12/11 at 11:00am. Please arrive 15 minutes prior to your test for registration. You will go to [facility] (1st Floor) for your appointment. Please refrain from eating or drinking anything 4 hours prior to your test. Should you need to cancel or reschedule your appointment, please contact [phone] [facility]) or [phone] [facility]). Continue Prilosec and Budesonide as directed. cc: Catha Gosselin, MD

## 2011-10-12 ENCOUNTER — Ambulatory Visit (HOSPITAL_COMMUNITY)
Admission: RE | Admit: 2011-10-12 | Discharge: 2011-10-12 | Disposition: A | Discharge status: Home / Self Care | Payer: Medicare Other | Source: Ambulatory Visit | Attending: Gastroenterology | Admitting: Gastroenterology

## 2011-10-12 DIAGNOSIS — R1312 Dysphagia, oropharyngeal phase: Secondary | ICD-10-CM

## 2011-10-12 NOTE — Procedures (Signed)
Outpatient Modified Barium Swallowing Study  Patient Details  Name: Kirk Rosario MRN: [id number] Date of Birth: [birth year]  Today's Date: 10/12/2011 Time: 1100-1145 SLP Time Calculation (min): 45 min  Past Medical History:  Past Medical History  Diagnosis Date  . Parkinson disease   . Hypercholesteremia   . Chest pain at rest 06/07/2011  . Hernia, hiatal   . Anxiety   . Headache   . Diabetes mellitus   . DM (diabetes mellitus) 06/07/2011  . CVA (cerebral infarction) 06/24/2011  . CAD (coronary artery disease) 06/25/2011  . Internal hemorrhoids   . Schatzki's ring   . Hiatal hernia   . Erosive esophagitis   . Tubular adenoma of colon 12/2008   Past Surgical History:  Past Surgical History  Procedure Date  . Hand surgery   . Cardiac catheterization 06/21/2011  . Upper gastrointestinal endoscopy     SQUAMOUS MUCOSA WITH DIFFUSE EPITHELIAL HYPERPLASIA, PARAKERATOSIS,   HPI:  71 y.o. male referred for an outpatient MBSS due to globus ("feels like a shelf in my throat"), feeling like he can't breathe and coughing during meals.  Patient has a h/o PD and CVA in January 2013.  He reports his symptoms have worsened since d/c home after CVA.     Assessment / Plan / Recommendation Clinical Impression  Dysphagia Diagnosis: Suspected primary esophageal dysphagia;Mild pharyngeal phase dysphagia Clinical impression: Demonstrates a likely primary esophageal dysphagia with a mild pharyngeal phase component marked by mildly reduced anterior hyoid-laryngeal excursion, premature spillage of thin bolus 50% of trials before the swallow response as well as consistent mild valleculae residue post swallow that was cleared with a cued repeat swallow.  Esophageal phase marked by what appeared to be all solid bolus trials (puree, mechanical soft, regular) to reside within the thoracic esophagus (proximal) with no visible transition without the assistance of thin liquid sips to clear food through into the  stomach.  This finding appears consistent with patient's symptom report of globus and "food not going down" as well as liquids regurgitating up when used to clear symptom.  Patient tends to eat all of this solids then drinks, which is part of the issue.  Educated patient and his wife to alternate solids and liquids as a means of "washing out the tube" during meals.  This may assist in reducing his symptoms and discomfort.    Treatment Recommendation  No treatment recommended at this time    Diet Recommendation Regular;Thin liquid   Follow Up Recommendations  None    Pertinent Vitals/Pain n/a    Reason for Referral Objectively evaluate swallow function   Myra Rude, M.S.,CCC-SLP Pager [phone] 10/12/2011, 12:09 PM

## 2011-10-13 ENCOUNTER — Telehealth: Payer: Self-pay

## 2011-10-13 DIAGNOSIS — K219 Gastro-esophageal reflux disease without esophagitis: Secondary | ICD-10-CM

## 2011-10-13 DIAGNOSIS — R1319 Other dysphagia: Secondary | ICD-10-CM

## 2011-10-13 MED ORDER — OMEPRAZOLE 20 MG PO CPDR: 20.0000 mg | DELAYED_RELEASE_CAPSULE | Freq: Two times a day (BID) | ORAL | Status: DC

## 2011-10-13 NOTE — Telephone Encounter (Signed)
Impression is mild pharygeal and moderate esophageal dysphagia. Follow all recommendation from SLP. Increase omeprazole to 20 mg po bid. Schedule barium esophagram with tablet to further evaluate esophageal dysphagia.  Above per Dr. Lamont Snowball, MD   The patient is advised of the above.  Barium swallow with tablet scheduled for 10/18/11 12:30 @ [facility].  He and his wife have been notified of the appt and to be 4 hours NPO

## 2011-10-13 NOTE — Telephone Encounter (Signed)
Addended by: Annett Fabian on: 10/13/2011 10:50 AM   Modules accepted: Orders

## 2011-10-18 ENCOUNTER — Ambulatory Visit (HOSPITAL_COMMUNITY)
Admission: RE | Admit: 2011-10-18 | Discharge: 2011-10-18 | Disposition: A | Discharge status: Home / Self Care | Payer: Medicare Other | Source: Ambulatory Visit | Attending: Gastroenterology | Admitting: Gastroenterology

## 2011-10-18 DIAGNOSIS — R1310 Dysphagia, unspecified: Secondary | ICD-10-CM | POA: Insufficient documentation

## 2011-10-18 DIAGNOSIS — Z8673 Personal history of transient ischemic attack (TIA), and cerebral infarction without residual deficits: Secondary | ICD-10-CM | POA: Insufficient documentation

## 2011-10-18 DIAGNOSIS — G20A1 Parkinson's disease without dyskinesia, without mention of fluctuations: Secondary | ICD-10-CM | POA: Insufficient documentation

## 2011-10-18 DIAGNOSIS — G20 Parkinson's disease: Secondary | ICD-10-CM | POA: Insufficient documentation

## 2011-12-18 ENCOUNTER — Other Ambulatory Visit: Payer: Self-pay

## 2011-12-18 DIAGNOSIS — K219 Gastro-esophageal reflux disease without esophagitis: Secondary | ICD-10-CM

## 2011-12-18 DIAGNOSIS — R1319 Other dysphagia: Secondary | ICD-10-CM

## 2011-12-18 MED ORDER — OMEPRAZOLE 20 MG PO CPDR: 20.0000 mg | DELAYED_RELEASE_CAPSULE | Freq: Two times a day (BID) | ORAL | Status: DC

## 2012-01-19 ENCOUNTER — Other Ambulatory Visit: Payer: Self-pay

## 2012-01-30 DIAGNOSIS — F419 Anxiety disorder, unspecified: Secondary | ICD-10-CM | POA: Diagnosis present

## 2012-06-14 ENCOUNTER — Telehealth: Payer: Self-pay | Admitting: Gastroenterology

## 2012-06-14 NOTE — Telephone Encounter (Signed)
Patient needs a copy of the last office note sent to his Md at the [place].  I have sent a copy to Dr. Archie Balboa at [phone]

## 2013-01-13 ENCOUNTER — Telehealth: Payer: Self-pay | Admitting: Cardiology

## 2013-01-13 NOTE — Telephone Encounter (Signed)
Called patient to schedule his yearly visit with Dr. Bryan Lemma.  [facility] and he does not need to see [place] any more.  I will delete the recall for patient to be seen by Dr. Herbie Baltimore.

## 2013-03-07 ENCOUNTER — Telehealth: Payer: Self-pay | Admitting: Gastroenterology

## 2013-03-07 DIAGNOSIS — R1319 Other dysphagia: Secondary | ICD-10-CM

## 2013-03-07 DIAGNOSIS — K219 Gastro-esophageal reflux disease without esophagitis: Secondary | ICD-10-CM

## 2013-03-07 MED ORDER — OMEPRAZOLE 20 MG PO CPDR: 20.0000 mg | DELAYED_RELEASE_CAPSULE | Freq: Two times a day (BID) | ORAL | Status: AC

## 2013-03-07 NOTE — Telephone Encounter (Signed)
Told patient that he is overdue for a follow up visit and needs to schedule or he can get further refills through his PCP. Patient states his PCP is the [place] and they will not prescribe this medicine. I told patient then he needs to schedule a follow-up visit. Pt agreed and appt scheduled for 04/02/13. Patient states he is out of medicine and needs a refill until his appt. Told patient that I will send him one 90 day supply but no refills until he comes in for his appt. Pt agreed.

## 2013-04-02 ENCOUNTER — Ambulatory Visit: Payer: Medicare Other | Admitting: Gastroenterology

## 2013-05-30 IMAGING — CR DG CHEST 2V
2 series · 2 of 2 positions shown · non-contrast
Comparison: 05/02/2011

CLINICAL DATA: Mid chest pain, history diabetes

CHEST - 2 VIEW

[w chest pa]
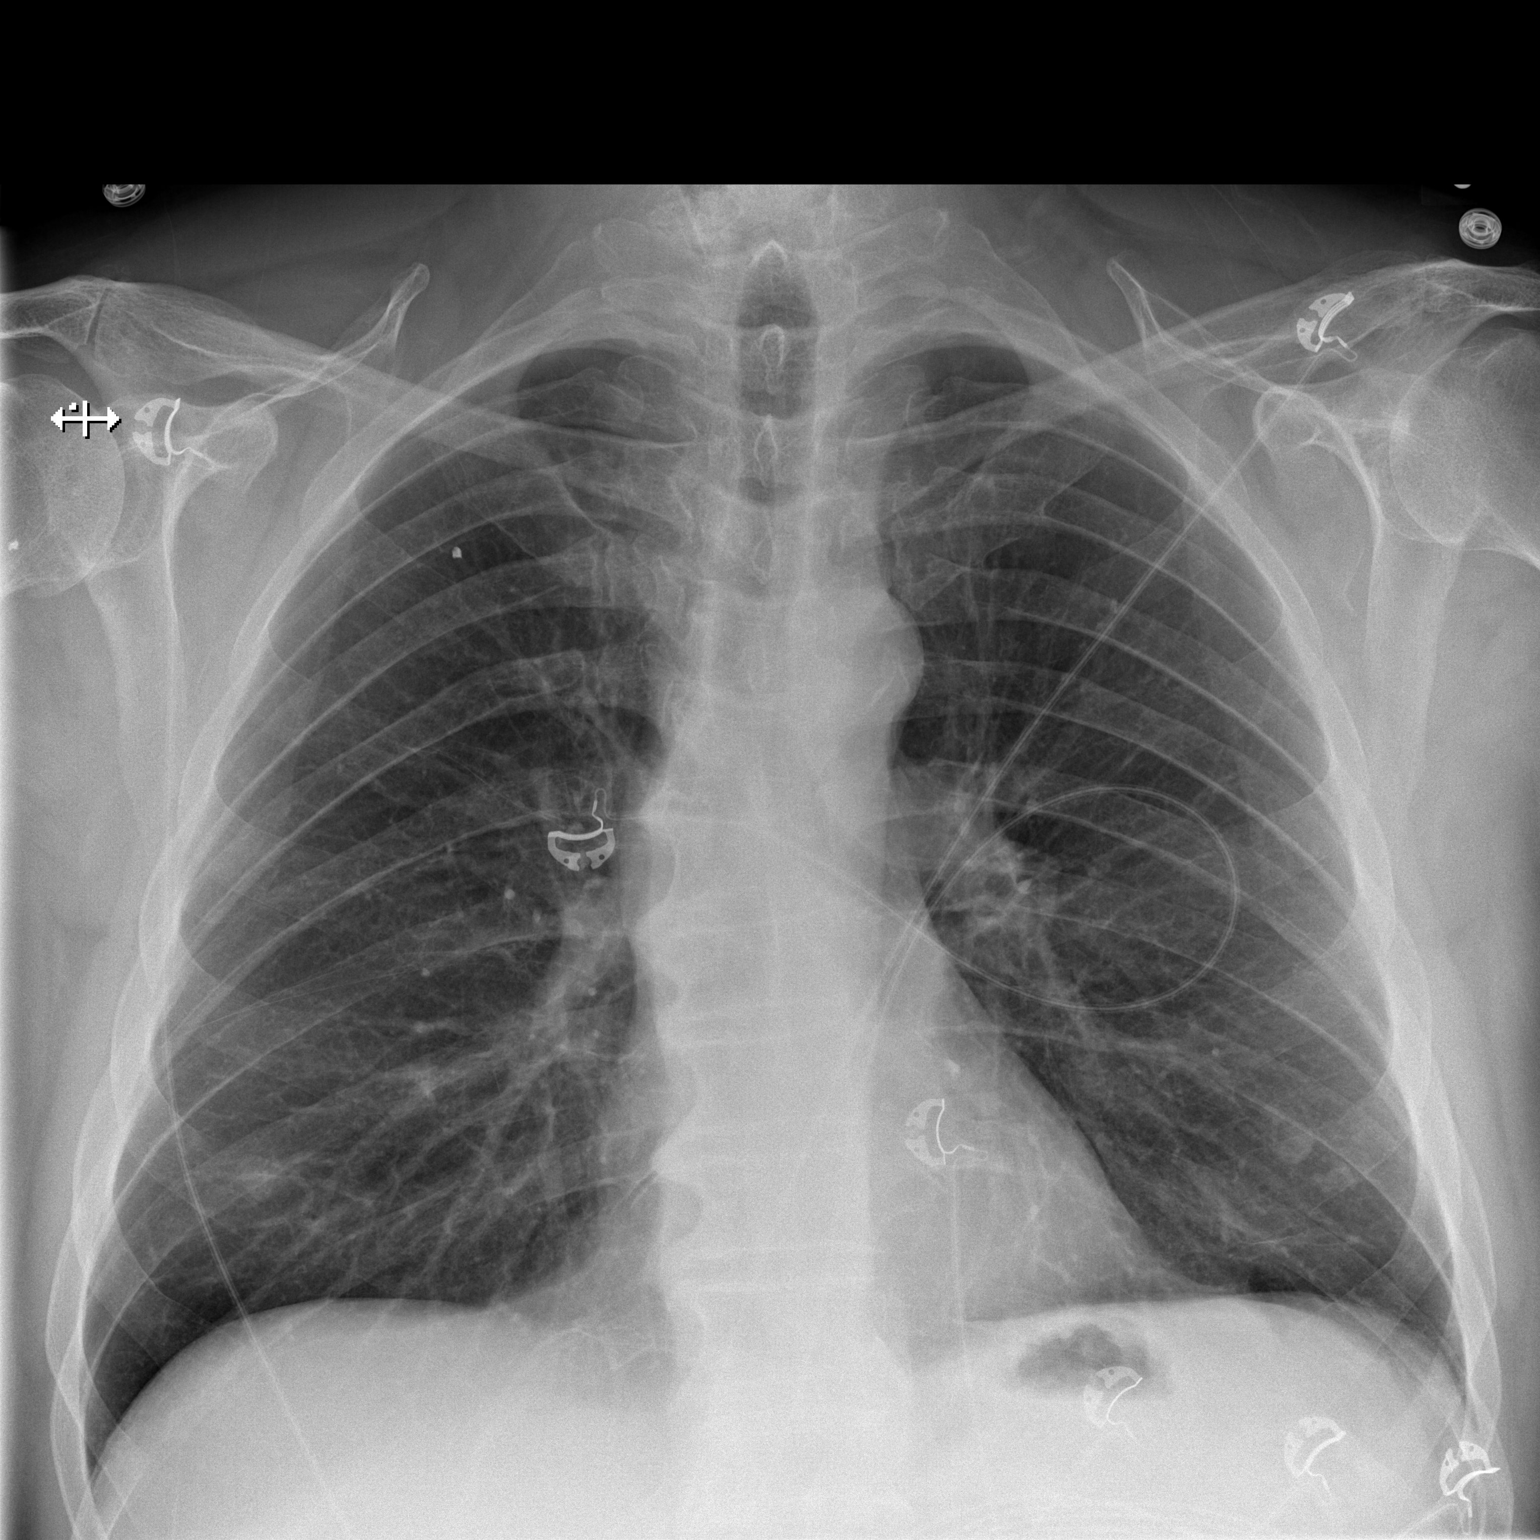

[w chest lat]
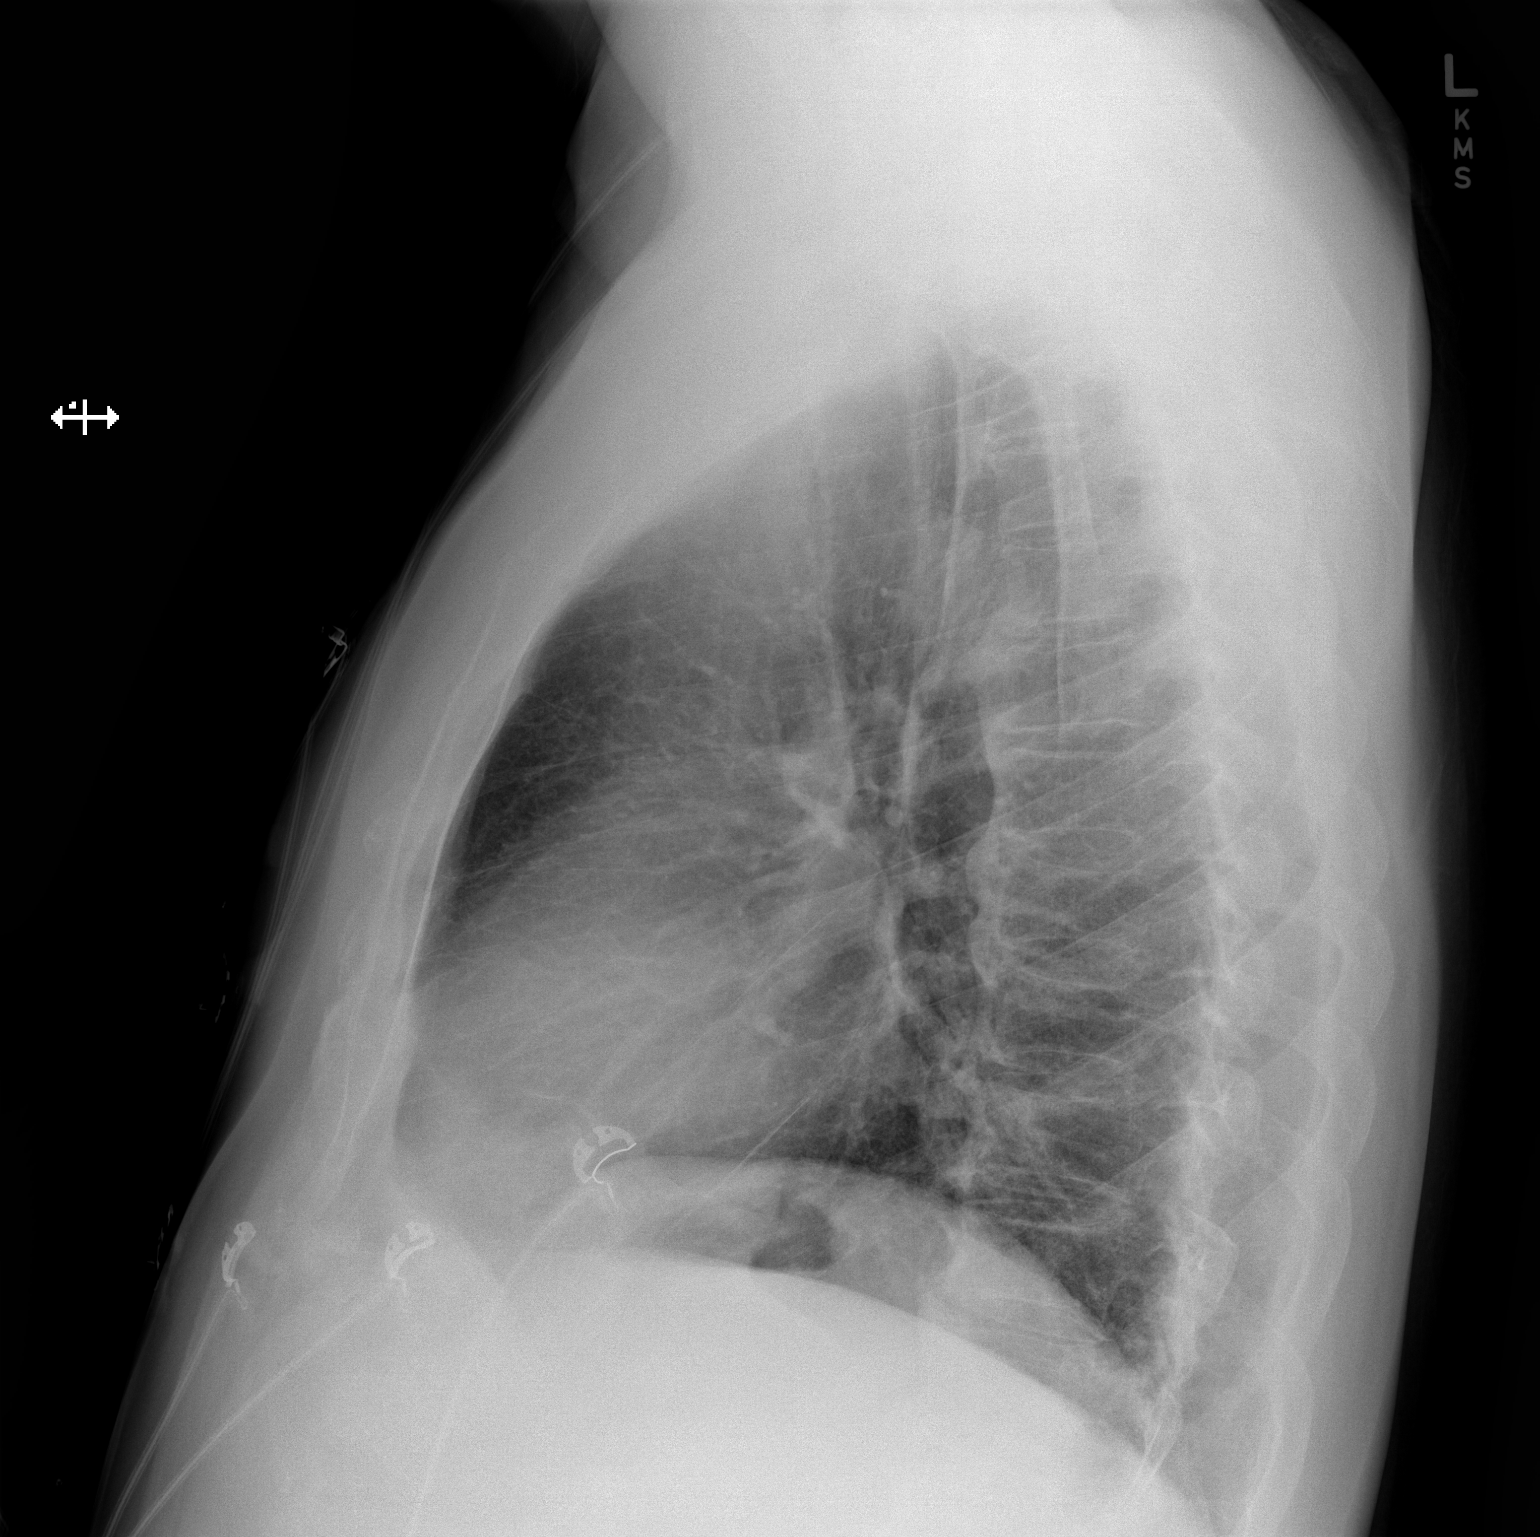

[2 of 2 positions shown; findings below may reference images not displayed]

FINDINGS: Normal heart size, mediastinal contours, and pulmonary vascularity.
Probable bilateral nipple shadows, unchanged since 08/19/2010.
Radiopaque foreign body projects over right upper lobe.
Lungs clear.
No pleural effusion or pneumothorax.
Bones unremarkable.
Minimal linear scarring left base stable.
IMPRESSION: No acute abnormalities.

## 2013-10-26 ENCOUNTER — Encounter (HOSPITAL_BASED_OUTPATIENT_CLINIC_OR_DEPARTMENT_OTHER): Payer: Self-pay | Admitting: Emergency Medicine

## 2013-10-26 ENCOUNTER — Emergency Department (HOSPITAL_BASED_OUTPATIENT_CLINIC_OR_DEPARTMENT_OTHER): Payer: Medicare Other

## 2013-10-26 ENCOUNTER — Emergency Department (HOSPITAL_BASED_OUTPATIENT_CLINIC_OR_DEPARTMENT_OTHER)
Admission: EM | Admit: 2013-10-26 | Discharge: 2013-10-27 | Disposition: A | Discharge status: Home / Self Care | Payer: Medicare Other | Attending: Emergency Medicine | Admitting: Emergency Medicine

## 2013-10-26 DIAGNOSIS — G20 Parkinson's disease: Secondary | ICD-10-CM | POA: Insufficient documentation

## 2013-10-26 DIAGNOSIS — Z87891 Personal history of nicotine dependence: Secondary | ICD-10-CM | POA: Insufficient documentation

## 2013-10-26 DIAGNOSIS — Y929 Unspecified place or not applicable: Secondary | ICD-10-CM | POA: Insufficient documentation

## 2013-10-26 DIAGNOSIS — Z9889 Other specified postprocedural states: Secondary | ICD-10-CM | POA: Insufficient documentation

## 2013-10-26 DIAGNOSIS — S8012XA Contusion of left lower leg, initial encounter: Secondary | ICD-10-CM

## 2013-10-26 DIAGNOSIS — E7800 Pure hypercholesterolemia, unspecified: Secondary | ICD-10-CM | POA: Insufficient documentation

## 2013-10-26 DIAGNOSIS — W010XXA Fall on same level from slipping, tripping and stumbling without subsequent striking against object, initial encounter: Secondary | ICD-10-CM | POA: Insufficient documentation

## 2013-10-26 DIAGNOSIS — Q391 Atresia of esophagus with tracheo-esophageal fistula: Secondary | ICD-10-CM | POA: Insufficient documentation

## 2013-10-26 DIAGNOSIS — X500XXA Overexertion from strenuous movement or load, initial encounter: Secondary | ICD-10-CM | POA: Insufficient documentation

## 2013-10-26 DIAGNOSIS — I2510 Atherosclerotic heart disease of native coronary artery without angina pectoris: Secondary | ICD-10-CM | POA: Insufficient documentation

## 2013-10-26 DIAGNOSIS — Z860100 Personal history of colon polyps, unspecified: Secondary | ICD-10-CM | POA: Insufficient documentation

## 2013-10-26 DIAGNOSIS — E119 Type 2 diabetes mellitus without complications: Secondary | ICD-10-CM | POA: Insufficient documentation

## 2013-10-26 DIAGNOSIS — Z8673 Personal history of transient ischemic attack (TIA), and cerebral infarction without residual deficits: Secondary | ICD-10-CM | POA: Insufficient documentation

## 2013-10-26 DIAGNOSIS — Q393 Congenital stenosis and stricture of esophagus: Secondary | ICD-10-CM

## 2013-10-26 DIAGNOSIS — Z7982 Long term (current) use of aspirin: Secondary | ICD-10-CM | POA: Insufficient documentation

## 2013-10-26 DIAGNOSIS — K208 Other esophagitis without bleeding: Secondary | ICD-10-CM | POA: Insufficient documentation

## 2013-10-26 DIAGNOSIS — Z79899 Other long term (current) drug therapy: Secondary | ICD-10-CM | POA: Insufficient documentation

## 2013-10-26 DIAGNOSIS — S8010XA Contusion of unspecified lower leg, initial encounter: Secondary | ICD-10-CM | POA: Insufficient documentation

## 2013-10-26 DIAGNOSIS — F411 Generalized anxiety disorder: Secondary | ICD-10-CM | POA: Insufficient documentation

## 2013-10-26 DIAGNOSIS — G20A1 Parkinson's disease without dyskinesia, without mention of fluctuations: Secondary | ICD-10-CM | POA: Insufficient documentation

## 2013-10-26 DIAGNOSIS — Y9389 Activity, other specified: Secondary | ICD-10-CM | POA: Insufficient documentation

## 2013-10-26 HISTORY — DX: Presence of other specified functional implants: Z96.89

## 2013-10-26 NOTE — ED Notes (Signed)
Tripped and fell two days ago, fell on his left leg.  Concerned that he is having left knee pain, left lower leg swelling and warmth.

## 2013-10-26 NOTE — ED Provider Notes (Addendum)
CSN: [id number]     Arrival date & time 10/26/13  2027 History   None    Chief Complaint  Patient presents with  . Knee Injury     (Consider location/radiation/quality/duration/timing/severity/associated sxs/prior Treatment) HPI This is a 73 year old male with Parkinson's disease. He lost his balance 2 days ago and fell, twisting his left lower leg under his right leg. He has subsequently developed swelling and pain of his left calf. There is no pain at rest but with walking he has pain that he rates as 5 out of 5. Sensation and motor function are intact in that leg. There is no deformity. He denies other injury.  Past Medical History  Diagnosis Date  . Parkinson disease   . Hypercholesteremia   . Chest pain at rest 06/07/2011  . Hernia, hiatal   . Anxiety   . Headache(784.0)   . Diabetes mellitus   . DM (diabetes mellitus) 06/07/2011  . CVA (cerebral infarction) 06/24/2011  . CAD (coronary artery disease) 06/25/2011  . Internal hemorrhoids   . Schatzki's ring   . Hiatal hernia   . Erosive esophagitis   . Tubular adenoma of colon 12/2008  . S/P deep brain stimulator placement    Past Surgical History  Procedure Laterality Date  . Hand surgery    . Cardiac catheterization  06/21/2011  . Upper gastrointestinal endoscopy      SQUAMOUS MUCOSA WITH DIFFUSE EPITHELIAL HYPERPLASIA, PARAKERATOSIS,   Family History  Problem Relation Age of Onset  . Heart failure Mother   . Cancer Father    History  Substance Use Topics  . Smoking status: Former Smoker    Quit date: 05/22/1966  . Smokeless tobacco: Never Used  . Alcohol Use: No    Review of Systems  All other systems reviewed and are negative.  Allergies  Review of patient's allergies indicates no known allergies.  Home Medications   Prior to Admission medications   Medication Sig Start Date End Date Taking? Authorizing Provider  gabapentin (NEURONTIN) 300 MG capsule Take 300 mg by mouth at bedtime.   Yes Historical  Provider, MD  lisinopril (PRINIVIL,ZESTRIL) 2.5 MG tablet Take 2.5 mg by mouth daily.   Yes Historical Provider, MD  prazosin (MINIPRESS) 2 MG capsule Take 2 mg by mouth at bedtime.   Yes Historical Provider, MD  ACCU-CHEK COMFORT CURVE test strip  05/08/11   Historical Provider, MD  AMBULATORY NON FORMULARY MEDICATION Budesonide 2mg /10mL twice daily suspension 08/15/11   Ladene Artist, MD  aspirin EC 325 MG tablet Take 325 mg by mouth daily as needed. For pain     Historical Provider, MD  carbidopa-levodopa (SINEMET) 25-100 MG per tablet Take 1 tablet by mouth 3 (three) times daily.      Historical Provider, MD  metFORMIN (GLUMETZA) 500 MG (MOD) 24 hr tablet Take 1,000 mg by mouth 2 (two) times daily with a meal.  06/23/11   Leonie Man, MD  metoprolol tartrate (LOPRESSOR) 25 MG tablet 25 mg tabs, take half a tablet to equal 12.5 mg twice a day. 06/25/11   Cecilie Kicks, NP  omeprazole (PRILOSEC) 20 MG capsule Take 1 capsule (20 mg total) by mouth 2 (two) times daily. 03/07/13 03/07/14  Ladene Artist, MD  PARoxetine (PAXIL) 20 MG tablet Take 40 mg by mouth daily.     Historical Provider, MD  pravastatin (PRAVACHOL) 40 MG tablet Take 40 mg by mouth every evening.      Historical Provider, MD  rOPINIRole (REQUIP)  3 MG tablet Take 2 mg by mouth 3 (three) times daily.     Historical Provider, MD  trihexyphenidyl (ARTANE) 2 MG tablet Take 2 mg by mouth 2 (two) times daily.     Historical Provider, MD   BP 154/77  Pulse 71  Temp(Src) 97.6 F (36.4 C) (Oral)  Resp 16  Ht 5\' 10"  (1.778 m)  Wt 238 lb (107.956 kg)  BMI 34.15 kg/m2  SpO2 98%  Physical Exam General: Well-developed, well-nourished male in no acute distress; appearance consistent with age of record HENT: normocephalic; surgical changes to the frontal skull and scalp Eyes: pupils equal, round and reactive to light; extraocular muscles intact Neck: supple Heart: regular rate and rhythm Lungs: clear to auscultation  bilaterally Abdomen: soft; nondistended Extremities: No deformity; full range of motion; pulses normal; swelling of left calf with pain on dorsiflexion of left foot, no ecchymosis; small contusion right shin Neurologic: Awake, alert and oriented; motor function intact in all extremities and symmetric; no facial droop Skin: Warm and dry Psychiatric: Normal mood and affect    ED Course  Procedures (including critical care time)   MDM  Nursing notes and vitals signs, including pulse oximetry, reviewed.  Summary of this visit's results, reviewed by myself:  Labs:  No results found for this or any previous visit (from the past 24 hour(s)).  Imaging Studies: Ct Tibia Fibula Left Wo Contrast  10/27/2013   CLINICAL DATA:  Patient tripped and fell 2 days ago. Left lower leg pain and swelling. Left calf injury.  EXAM: CT TIBIA FIBULA LEFT WITHOUT CONTRAST  TECHNIQUE: Multidetector CT imaging of the bilateral lower extremities was performed according to the standard protocol. Multiplanar CT image reconstructions were also generated.  COMPARISON:  None.  FINDINGS: Degenerative changes in the left knee. No significant effusion. Circumscribed cyst in the posterior proximal tibia probably representing degenerative cyst. Degenerative changes in the tibiotalar joint. No displaced fractures identified. Bone cortex appears intact. Diffuse soft tissue infiltration throughout the lower leg and ankle consistent with soft tissue edema. Focal crescentic hyperattenuation demonstrated in the medial aspect of the calf along the superficial aspect of the left medial gastrocnemius muscle consistent with acute hematoma. Hematoma measures about 0.6 by 5 cm in the axial plane and extends for a longitudinal dimension of about 9.4 cm. Diffuse arterial vascular calcifications are noted.  IMPRESSION: Hematoma in the superficial aspect of the left medial gastrocnemius compartment. No acute bony injury identified.   Electronically  Signed   By: Lucienne Capers M.D.   On: 10/27/2013 00:44        Wynetta Fines, MD 10/27/13 0350  Wynetta Fines, MD 10/27/13 [phone]

## 2013-10-27 NOTE — Discharge Instructions (Signed)
Hematoma A hematoma is a collection of blood under the skin, in an organ, in a body space, in a joint space, or in other tissue. The blood can clot to form a lump that you can see and feel. The lump is often firm and may sometimes become sore and tender. Most hematomas get better in a few days to weeks. However, some hematomas may be serious and require medical care. Hematomas can range in size from very small to very large. CAUSES  A hematoma can be caused by a blunt or penetrating injury. It can also be caused by spontaneous leakage from a blood vessel under the skin. Spontaneous leakage from a blood vessel is more likely to occur in older people, especially those taking blood thinners. Sometimes, a hematoma can develop after certain medical procedures. SIGNS AND SYMPTOMS   A firm lump on the body.  Possible pain and tenderness in the area.  Bruising.Blue, dark blue, purple-red, or yellowish skin may appear at the site of the hematoma if the hematoma is close to the surface of the skin.  DIAGNOSIS  A hematoma can usually be diagnosed based on your medical history and a physical exam. Imaging tests may be needed if your health care provider suspects a hematoma in deeper tissues or body spaces, such as the abdomen, head, or chest. These tests may include ultrasonography or a CT scan.  TREATMENT  Hematomas usually go away on their own over time. Rarely does the blood need to be drained out of the body. Large hematomas or those that may affect vital organs will sometimes need surgical drainage or monitoring. HOME CARE INSTRUCTIONS   Apply ice to the injured area:   Put ice in a plastic bag.   Place a towel between your skin and the bag.   Leave the ice on for 20 minutes, 2 3 times a day for the first 1 to 2 days.   After the first 2 days, switch to using warm compresses on the hematoma.   Elevate the injured area to help decrease pain and swelling. Wrapping the area with an elastic  bandage may also be helpful. Compression helps to reduce swelling and promotes shrinking of the hematoma. Make sure the bandage is not wrapped too tight.   If your hematoma is on a lower extremity and is painful, crutches may be helpful for a couple days.   Only take over-the-counter or prescription medicines as directed by your health care provider. SEEK IMMEDIATE MEDICAL CARE IF:   You have increasing pain, or your pain is not controlled with medicine.   You have a fever.   You have worsening swelling or discoloration.   Your skin over the hematoma breaks or starts bleeding.   Your hematoma is in your chest or abdomen and you have weakness, shortness of breath, or a change in consciousness.  Your hematoma is on your scalp (caused by a fall or injury) and you have a worsening headache or a change in alertness or consciousness. MAKE SURE YOU:   Understand these instructions.  Will watch your condition.  Will get help right away if you are not doing well or get worse. Document Released: 12/21/2003 Document Revised: 01/08/2013 Document Reviewed: 10/16/2012 [facility] Patient Information 2014 [place].

## 2013-10-28 ENCOUNTER — Encounter: Payer: Self-pay | Admitting: Gastroenterology

## 2013-11-11 ENCOUNTER — Emergency Department (HOSPITAL_BASED_OUTPATIENT_CLINIC_OR_DEPARTMENT_OTHER)
Admission: EM | Admit: 2013-11-11 | Discharge: 2013-11-11 | Disposition: A | Discharge status: Home / Self Care | Payer: Medicare Other | Attending: Emergency Medicine | Admitting: Emergency Medicine

## 2013-11-11 ENCOUNTER — Emergency Department (HOSPITAL_BASED_OUTPATIENT_CLINIC_OR_DEPARTMENT_OTHER): Payer: Medicare Other

## 2013-11-11 DIAGNOSIS — Y929 Unspecified place or not applicable: Secondary | ICD-10-CM | POA: Insufficient documentation

## 2013-11-11 DIAGNOSIS — K222 Esophageal obstruction: Secondary | ICD-10-CM | POA: Insufficient documentation

## 2013-11-11 DIAGNOSIS — S8010XA Contusion of unspecified lower leg, initial encounter: Secondary | ICD-10-CM | POA: Insufficient documentation

## 2013-11-11 DIAGNOSIS — Z8673 Personal history of transient ischemic attack (TIA), and cerebral infarction without residual deficits: Secondary | ICD-10-CM | POA: Insufficient documentation

## 2013-11-11 DIAGNOSIS — Z9889 Other specified postprocedural states: Secondary | ICD-10-CM | POA: Insufficient documentation

## 2013-11-11 DIAGNOSIS — X58XXXA Exposure to other specified factors, initial encounter: Secondary | ICD-10-CM | POA: Insufficient documentation

## 2013-11-11 DIAGNOSIS — Y9389 Activity, other specified: Secondary | ICD-10-CM | POA: Insufficient documentation

## 2013-11-11 DIAGNOSIS — E7800 Pure hypercholesterolemia, unspecified: Secondary | ICD-10-CM | POA: Insufficient documentation

## 2013-11-11 DIAGNOSIS — E119 Type 2 diabetes mellitus without complications: Secondary | ICD-10-CM | POA: Insufficient documentation

## 2013-11-11 DIAGNOSIS — I2510 Atherosclerotic heart disease of native coronary artery without angina pectoris: Secondary | ICD-10-CM | POA: Insufficient documentation

## 2013-11-11 DIAGNOSIS — Z79899 Other long term (current) drug therapy: Secondary | ICD-10-CM | POA: Insufficient documentation

## 2013-11-11 DIAGNOSIS — Z860100 Personal history of colon polyps, unspecified: Secondary | ICD-10-CM | POA: Insufficient documentation

## 2013-11-11 DIAGNOSIS — Z87891 Personal history of nicotine dependence: Secondary | ICD-10-CM | POA: Insufficient documentation

## 2013-11-11 DIAGNOSIS — S8012XA Contusion of left lower leg, initial encounter: Secondary | ICD-10-CM

## 2013-11-11 NOTE — ED Notes (Signed)
Pt. Reports a fall on the 5th of June and was seen here on the 7th of June due to pain and swelling.  Pt. Was seen today at the [facility] in [place].  Pt. Was told he needed an [place] due to L lower leg swelling at the ankle and foot.  Pt. Reports pain with standing and walking.

## 2013-11-11 NOTE — ED Provider Notes (Signed)
CSN: [id number]     Arrival date & time 11/11/13  1513 History   First MD Initiated Contact with Patient 11/11/13 1525     No chief complaint on file.    (Consider location/radiation/quality/duration/timing/severity/associated sxs/prior Treatment) HPI Patient presents with ongoing left lower extremity pain.  Patient had a mechanical fall 18 years ago. Patient was seen here 2 days after the event, and evaluation was largely reassuring, though the patient did not have the recommended ultrasound at that time. Today the patient was following up with his primary care team, was referred here for ultrasound evaluation. He notes no interval fevers, chills, dyspnea, chest pain. There is ongoing severe, sore pain in the left calf. Pain is worse with ambulation, minimally better at rest and with aspirin  Past Medical History  Diagnosis Date  . Parkinson disease   . Hypercholesteremia   . Chest pain at rest 06/07/2011  . Hernia, hiatal   . Anxiety   . Headache(784.0)   . Diabetes mellitus   . DM (diabetes mellitus) 06/07/2011  . CVA (cerebral infarction) 06/24/2011  . CAD (coronary artery disease) 06/25/2011  . Internal hemorrhoids   . Schatzki's ring   . Hiatal hernia   . Erosive esophagitis   . Tubular adenoma of colon 12/2008  . S/P deep brain stimulator placement    Past Surgical History  Procedure Laterality Date  . Hand surgery    . Cardiac catheterization  06/21/2011  . Upper gastrointestinal endoscopy      SQUAMOUS MUCOSA WITH DIFFUSE EPITHELIAL HYPERPLASIA, PARAKERATOSIS,  . Deep brain stimulator placement     Family History  Problem Relation Age of Onset  . Heart failure Mother   . Cancer Father    History  Substance Use Topics  . Smoking status: Former Smoker    Quit date: 05/22/1966  . Smokeless tobacco: Never Used  . Alcohol Use: No    Review of Systems  Constitutional:       Per HPI, otherwise negative  HENT:       Per HPI, otherwise negative  Respiratory:        Per HPI, otherwise negative  Cardiovascular:       Per HPI, otherwise negative  Gastrointestinal: Negative for vomiting.  Endocrine:       Negative aside from HPI  Genitourinary:       Neg aside from HPI   Musculoskeletal:       Per HPI, otherwise negative  Skin: Negative.   Neurological: Negative for syncope.      Allergies  Review of patient's allergies indicates no known allergies.  Home Medications   Prior to Admission medications   Medication Sig Start Date End Date Taking? Authorizing Provider  ACCU-CHEK COMFORT CURVE test strip  05/08/11   Historical Provider, MD  AMBULATORY NON FORMULARY MEDICATION Budesonide 2mg /25mL twice daily suspension 08/15/11   Ladene Artist, MD  aspirin EC 325 MG tablet Take 325 mg by mouth daily as needed. For pain     Historical Provider, MD  carbidopa-levodopa (SINEMET) 25-100 MG per tablet Take 1 tablet by mouth 3 (three) times daily.      Historical Provider, MD  gabapentin (NEURONTIN) 300 MG capsule Take 300 mg by mouth at bedtime.    Historical Provider, MD  lisinopril (PRINIVIL,ZESTRIL) 2.5 MG tablet Take 2.5 mg by mouth daily.    Historical Provider, MD  metFORMIN (GLUMETZA) 500 MG (MOD) 24 hr tablet Take 1,000 mg by mouth 2 (two) times daily with a meal.  06/23/11   Leonie Man, MD  metoprolol tartrate (LOPRESSOR) 25 MG tablet 25 mg tabs, take half a tablet to equal 12.5 mg twice a day. 06/25/11   Cecilie Kicks, NP  omeprazole (PRILOSEC) 20 MG capsule Take 1 capsule (20 mg total) by mouth 2 (two) times daily. 03/07/13 03/07/14  Ladene Artist, MD  PARoxetine (PAXIL) 20 MG tablet Take 40 mg by mouth daily.     Historical Provider, MD  pravastatin (PRAVACHOL) 40 MG tablet Take 40 mg by mouth every evening.      Historical Provider, MD  prazosin (MINIPRESS) 2 MG capsule Take 2 mg by mouth at bedtime.    Historical Provider, MD  rOPINIRole (REQUIP) 3 MG tablet Take 2 mg by mouth 3 (three) times daily.     Historical Provider, MD   trihexyphenidyl (ARTANE) 2 MG tablet Take 2 mg by mouth 2 (two) times daily.     Historical Provider, MD   BP 152/73  Pulse 84  Temp(Src) 98 F (36.7 C) (Oral)  Resp 20  SpO2 96% Physical Exam  Nursing note and vitals reviewed. Constitutional: He is oriented to person, place, and time. He appears well-developed. No distress.  HENT:  Head: Normocephalic and atraumatic.  Eyes: Conjunctivae and EOM are normal.  Cardiovascular: Normal rate, regular rhythm and intact distal pulses.   Pulmonary/Chest: Effort normal. No stridor. No respiratory distress.  Musculoskeletal: He exhibits edema.  Left calf is larger than the right, with no erythema, discharge, drainage.  There is tenderness to palpation about the distal popliteal fossa. Range of motion and strength are appropriate in knee and ankle, with no tenderness to palpation beyond the popliteal fossa  Neurological: He is alert and oriented to person, place, and time.  Skin: Skin is warm and dry.  Psychiatric: He has a normal mood and affect.    ED Course  Procedures (including critical care time)  Imaging Review US Venous Img Lower Unilateral Left  11/11/2013   CLINICAL DATA:  Left calf injury. Pain and swelling in the popliteal region.  EXAM: LEFT LOWER EXTREMITY VENOUS DOPPLER ULTRASOUND  TECHNIQUE: Gray-scale sonography with graded compression, as well as color Doppler and duplex ultrasound were performed to evaluate the lower extremity deep venous systems from the level of the common femoral vein and including the common femoral, femoral, profunda femoral, popliteal and calf veins including the posterior tibial, peroneal and gastrocnemius veins when visible. The superficial great saphenous vein was also interrogated. Spectral Doppler was utilized to evaluate flow at rest and with distal augmentation maneuvers in the common femoral, femoral and popliteal veins.  COMPARISON:  Lower extremity CT scan 10/26/2013  FINDINGS: Common Femoral  Vein: No evidence of thrombus. Normal compressibility, respiratory phasicity and response to augmentation.  Saphenofemoral Junction: No evidence of thrombus. Normal compressibility and flow on color Doppler imaging.  Profunda Femoral Vein: No evidence of thrombus. Normal compressibility and flow on color Doppler imaging.  Femoral Vein: No evidence of thrombus. Normal compressibility, respiratory phasicity and response to augmentation.  Popliteal Vein: No evidence of thrombus. Normal compressibility, respiratory phasicity and response to augmentation.  Calf Veins: No evidence of thrombus. Normal compressibility and flow on color Doppler imaging.  Superficial Great Saphenous Vein: No evidence of thrombus. Normal compressibility and flow on color Doppler imaging.  Venous Reflux:  None.  Other Findings: Hypoechoic 12 by 2 by 6.5 cm complex fluid collection within the medial aspect of the upper calf. This collection corresponds anatomically with hematoma seen on Prior  CT imaging.  IMPRESSION: 1. No evidence of deep venous thrombosis. 2. Hypoechoic complex fluid collection within the medial aspect of the upper calf corresponding with the hematoma seen on Prior CT scan.   Electronically Signed   By: Jacqulynn Cadet M.D.   On: 11/11/2013 17:30   After the initial evaluation I reviewed the patient's chart.  On repeat exam the patient is in no distress.  He will followup at the [place], or with orthopedics.   MDM   Final diagnoses:  Hematoma of leg, left, initial encounter    Patient presents with ongoing left lower extremity pain.  Patient's evaluation is notable for preserved distal neurovascular status, and no evidence of a DVT on exam. Patient was discharged in stable condition to continue his analgesia regimen, and follow up with orthopedics or the [place].     Carmin Muskrat, MD 11/11/13 [phone]

## 2013-11-11 NOTE — Discharge Instructions (Signed)
As discussed, your evaluation today has been largely reassuring.  But, it is important that you monitor your condition carefully, and do not hesitate to return to the ED if you develop new, or concerning changes in your condition.  Otherwise, please follow-up with your physician for appropriate ongoing care.  Hematoma A hematoma is a collection of blood under the skin, in an organ, in a body space, in a joint space, or in other tissue. The blood can clot to form a lump that you can see and feel. The lump is often firm and may sometimes become sore and tender. Most hematomas get better in a few days to weeks. However, some hematomas may be serious and require medical care. Hematomas can range in size from very small to very large. CAUSES  A hematoma can be caused by a blunt or penetrating injury. It can also be caused by spontaneous leakage from a blood vessel under the skin. Spontaneous leakage from a blood vessel is more likely to occur in older people, especially those taking blood thinners. Sometimes, a hematoma can develop after certain medical procedures. SIGNS AND SYMPTOMS   A firm lump on the body.  Possible pain and tenderness in the area.  Bruising.Blue, dark blue, purple-red, or yellowish skin may appear at the site of the hematoma if the hematoma is close to the surface of the skin. For hematomas in deeper tissues or body spaces, the signs and symptoms may be subtle. For example, an intra-abdominal hematoma may cause abdominal pain, weakness, fainting, and shortness of breath. An intracranial hematoma may cause a headache or symptoms such as weakness, trouble speaking, or a change in consciousness. DIAGNOSIS  A hematoma can usually be diagnosed based on your medical history and a physical exam. Imaging tests may be needed if your health care provider suspects a hematoma in deeper tissues or body spaces, such as the abdomen, head, or chest. These tests may include ultrasonography or a CT  scan.  TREATMENT  Hematomas usually go away on their own over time. Rarely does the blood need to be drained out of the body. Large hematomas or those that may affect vital organs will sometimes need surgical drainage or monitoring. HOME CARE INSTRUCTIONS   Apply ice to the injured area:   Put ice in a plastic bag.   Place a towel between your skin and the bag.   Leave the ice on for 20 minutes, 2-3 times a day for the first 1 to 2 days.   After the first 2 days, switch to using warm compresses on the hematoma.   Elevate the injured area to help decrease pain and swelling. Wrapping the area with an elastic bandage may also be helpful. Compression helps to reduce swelling and promotes shrinking of the hematoma. Make sure the bandage is not wrapped too tight.   If your hematoma is on a lower extremity and is painful, crutches may be helpful for a couple days.   Only take over-the-counter or prescription medicines as directed by your health care provider. SEEK IMMEDIATE MEDICAL CARE IF:   You have increasing pain, or your pain is not controlled with medicine.   You have a fever.   You have worsening swelling or discoloration.   Your skin over the hematoma breaks or starts bleeding.   Your hematoma is in your chest or abdomen and you have weakness, shortness of breath, or a change in consciousness.  Your hematoma is on your scalp (caused by a fall or  injury) and you have a worsening headache or a change in alertness or consciousness. MAKE SURE YOU:   Understand these instructions.  Will watch your condition.  Will get help right away if you are not doing well or get worse. Document Released: 12/21/2003 Document Revised: 01/08/2013 Document Reviewed: 10/16/2012 [facility] Patient Information 2015 [place], [place]. This information is not intended to replace advice given to you by your health care provider. Make sure you discuss any questions you have with your health care  provider.  Cryotherapy Cryotherapy means treatment with cold. Ice or gel packs can be used to reduce both pain and swelling. Ice is the most helpful within the first 24 to 48 hours after an injury or flareup from overusing a muscle or joint. Sprains, strains, spasms, burning pain, shooting pain, and aches can all be eased with ice. Ice can also be used when recovering from surgery. Ice is effective, has very few side effects, and is safe for most people to use. PRECAUTIONS  Ice is not a safe treatment option for people with:  Raynaud's phenomenon. This is a condition affecting small blood vessels in the extremities. Exposure to cold may cause your problems to return.  Cold hypersensitivity. There are many forms of cold hypersensitivity, including:  Cold urticaria. Red, itchy hives appear on the skin when the tissues begin to warm after being iced.  Cold erythema. This is a red, itchy rash caused by exposure to cold.  Cold hemoglobinuria. Red blood cells break down when the tissues begin to warm after being iced. The hemoglobin that carry oxygen are passed into the urine because they cannot combine with blood proteins fast enough.  Numbness or altered sensitivity in the area being iced. If you have any of the following conditions, do not use ice until you have discussed cryotherapy with your caregiver:  Heart conditions, such as arrhythmia, angina, or chronic heart disease.  High blood pressure.  Healing wounds or open skin in the area being iced.  Current infections.  Rheumatoid arthritis.  Poor circulation.  Diabetes. Ice slows the blood flow in the region it is applied. This is beneficial when trying to stop inflamed tissues from spreading irritating chemicals to surrounding tissues. However, if you expose your skin to cold temperatures for too long or without the proper protection, you can damage your skin or nerves. Watch for signs of skin damage due to cold. HOME CARE  INSTRUCTIONS Follow these tips to use ice and cold packs safely.  Place a dry or damp towel between the ice and skin. A damp towel will cool the skin more quickly, so you may need to shorten the time that the ice is used.  For a more rapid response, add gentle compression to the ice.  Ice for no more than 10 to 20 minutes at a time. The bonier the area you are icing, the less time it will take to get the benefits of ice.  Check your skin after 5 minutes to make sure there are no signs of a poor response to cold or skin damage.  Rest 20 minutes or more in between uses.  Once your skin is numb, you can end your treatment. You can test numbness by very lightly touching your skin. The touch should be so light that you do not see the skin dimple from the pressure of your fingertip. When using ice, most people will feel these normal sensations in this order: cold, burning, aching, and numbness.  Do not use ice  on someone who cannot communicate their responses to pain, such as small children or people with dementia. HOW TO MAKE AN ICE PACK Ice packs are the most common way to use ice therapy. Other methods include ice massage, ice baths, and cryo-sprays. Muscle creams that cause a cold, tingly feeling do not offer the same benefits that ice offers and should not be used as a substitute unless recommended by your caregiver. To make an ice pack, do one of the following:  Place crushed ice or a bag of frozen vegetables in a sealable plastic bag. Squeeze out the excess air. Place this bag inside another plastic bag. Slide the bag into a pillowcase or place a damp towel between your skin and the bag.  Mix 3 parts water with 1 part rubbing alcohol. Freeze the mixture in a sealable plastic bag. When you remove the mixture from the freezer, it will be slushy. Squeeze out the excess air. Place this bag inside another plastic bag. Slide the bag into a pillowcase or place a damp towel between your skin and the  bag. SEEK MEDICAL CARE IF:  You develop white spots on your skin. This may give the skin a blotchy (mottled) appearance.  Your skin turns blue or pale.  Your skin becomes waxy or hard.  Your swelling gets worse. MAKE SURE YOU:   Understand these instructions.  Will watch your condition.  Will get help right away if you are not doing well or get worse. Document Released: 01/02/2011 Document Revised: 07/31/2011 Document Reviewed: 01/02/2011 [facility] Patient Information 2015 [place], [place]. This information is not intended to replace advice given to you by your health care provider. Make sure you discuss any questions you have with your health care provider.

## 2013-11-27 ENCOUNTER — Encounter: Payer: Self-pay | Admitting: Gastroenterology

## 2014-01-09 ENCOUNTER — Ambulatory Visit (AMBULATORY_SURGERY_CENTER): Payer: Self-pay

## 2014-01-09 VITALS — Ht 70.5 in | Wt 240.0 lb

## 2014-01-09 DIAGNOSIS — Z860100 Personal history of colon polyps, unspecified: Secondary | ICD-10-CM

## 2014-01-09 MED ORDER — MOVIPREP 100 G PO SOLR: 1.0000 | Freq: Once | ORAL | Status: DC

## 2014-01-09 NOTE — Progress Notes (Signed)
No allergies to eggs or soy No past problems with anesthesia No home oxygen but does have CPAP No diet/weight loss meds  Has email  Emmi instructions given for colonoscopy  Has Parkinson's, deep brain stimulator, diabetic

## 2014-01-23 ENCOUNTER — Encounter: Payer: Self-pay | Admitting: Gastroenterology

## 2014-01-23 ENCOUNTER — Ambulatory Visit (AMBULATORY_SURGERY_CENTER): Payer: Medicare Other | Admitting: Gastroenterology

## 2014-01-23 VITALS — BP 128/70 | HR 63 | Temp 96.9°F | Resp 22 | Ht 70.5 in | Wt 240.0 lb

## 2014-01-23 DIAGNOSIS — Z86010 Personal history of colonic polyps: Secondary | ICD-10-CM

## 2014-01-23 DIAGNOSIS — D126 Benign neoplasm of colon, unspecified: Secondary | ICD-10-CM

## 2014-01-23 DIAGNOSIS — D123 Benign neoplasm of transverse colon: Secondary | ICD-10-CM

## 2014-01-23 LAB — GLUCOSE, CAPILLARY
Glucose-Capillary: 123 mg/dL — ABNORMAL HIGH (ref 70–99)
Glucose-Capillary: 122 mg/dL — ABNORMAL HIGH (ref 70–99)

## 2014-01-23 MED ORDER — SODIUM CHLORIDE 0.9 % IV SOLN: 500.0000 mL | INTRAVENOUS | Status: DC

## 2014-01-23 NOTE — Progress Notes (Signed)
Procedure ends, to recovery, report given and VSS. 

## 2014-01-23 NOTE — Patient Instructions (Signed)
YOU HAD AN ENDOSCOPIC PROCEDURE TODAY AT THE [facility]: Refer to the procedure report that was given to you for any specific questions about what was found during the examination.  If the procedure report does not answer your questions, please call your gastroenterologist to clarify.  If you requested that your care partner not be given the details of your procedure findings, then the procedure report has been included in a sealed envelope for you to review at your convenience later.  YOU SHOULD EXPECT: Some feelings of bloating in the abdomen. Passage of more gas than usual.  Walking can help get rid of the air that was put into your GI tract during the procedure and reduce the bloating. If you had a lower endoscopy (such as a colonoscopy or flexible sigmoidoscopy) you may notice spotting of blood in your stool or on the toilet paper. If you underwent a bowel prep for your procedure, then you may not have a normal bowel movement for a few days.  DIET: Your first meal following the procedure should be a light meal and then it is ok to progress to your normal diet.  A half-sandwich or bowl of soup is an example of a good first meal.  Heavy or fried foods are harder to digest and may make you feel nauseous or bloated.  Likewise meals heavy in dairy and vegetables can cause extra gas to form and this can also increase the bloating.  Drink plenty of fluids but you should avoid alcoholic beverages for 24 hours.  ACTIVITY: Your care partner should take you home directly after the procedure.  You should plan to take it easy, moving slowly for the rest of the day.  You can resume normal activity the day after the procedure however you should NOT DRIVE or use heavy machinery for 24 hours (because of the sedation medicines used during the test).    SYMPTOMS TO REPORT IMMEDIATELY: A gastroenterologist can be reached at any hour.  During normal business hours, 8:30 AM to 5:00 PM Monday through Friday,  call [phone].  After hours and on weekends, please call the GI answering service at [phone] who will take a message and have the physician on call contact you.   Following lower endoscopy (colonoscopy or flexible sigmoidoscopy):  Excessive amounts of blood in the stool  Significant tenderness or worsening of abdominal pains  Swelling of the abdomen that is new, acute  Fever of 100F or higher  FOLLOW UP: If any biopsies were taken you will be contacted by phone or by letter within the next 1-3 weeks.  Call your gastroenterologist if you have not heard about the biopsies in 3 weeks.  Our staff will call the home number listed on your records the next business day following your procedure to check on you and address any questions or concerns that you may have at that time regarding the information given to you following your procedure. This is a courtesy call and so if there is no answer at the home number and we have not heard from you through the emergency physician on call, we will assume that you have returned to your regular daily activities without incident.  SIGNATURES/CONFIDENTIALITY: You and/or your care partner have signed paperwork which will be entered into your electronic medical record.  These signatures attest to the fact that that the information above on your After Visit Summary has been reviewed and is understood.  Full responsibility of the confidentiality of this   discharge information lies with you and/or your care-partner.   Please, read the handouts given to you by your recovery room nurse. 

## 2014-01-23 NOTE — Op Note (Signed)
[place]  [address] [place], [id number]   COLONOSCOPY PROCEDURE REPORT  PATIENT: Kirk Rosario, Kirk Rosario  MR#: [id number] BIRTHDATE: 1941/04/15 , 73  yrs. old GENDER: Male ENDOSCOPIST: Ladene Artist, MD, [facility] PROCEDURE DATE:  01/23/2014 PROCEDURE:   Colonoscopy with snare polypectomy First Screening Colonoscopy - Avg.  risk and is 50 yrs.  old or older - No.  Prior Negative Screening - Now for repeat screening. N/A  History of Adenoma - Now for follow-up colonoscopy & has been > or = to 3 yrs.  Yes hx of adenoma.  Has been 3 or more years since last colonoscopy.  Polyps Removed Today? Yes. ASA CLASS:   Class II INDICATIONS:Patient's personal history of adenomatous colon polyps.  MEDICATIONS: MAC sedation, administered by CRNA and propofol (Diprivan) 280mg  IV DESCRIPTION OF PROCEDURE:   After the risks benefits and alternatives of the procedure were thoroughly explained, informed consent was obtained.  A digital rectal exam revealed no abnormalities of the rectum.   The LB [id number]  endoscope was introduced through the anus and advanced to the cecum, which was identified by both the appendix and ileocecal valve. No adverse events experienced.   The quality of the prep was good, using MoviPrep  The instrument was then slowly withdrawn as the colon was fully examined.  COLON FINDINGS: A sessile polyp measuring 5 mm in size was found in the transverse colon.  A polypectomy was performed with a cold snare.  The resection was complete and the polyp tissue was completely retrieved.   The colon was otherwise normal.  There was no diverticulosis, inflammation, polyps or cancers unless previously stated.  Retroflexed views revealed small internal hemorrhoids. The time to cecum=2 minutes 10 seconds.  Withdrawal time=10 minutes 49 seconds.  The scope was withdrawn and the procedure completed.  COMPLICATIONS: There were no complications.  ENDOSCOPIC  IMPRESSION: 1.   Sessile polyp measuring 5 mm in the transverse colon; polypectomy performed with a cold snare 2.   Small internal hemorrhoids  RECOMMENDATIONS: 1.  Await pathology results 2.  Repeat Colonoscopy in 5 years.  eSigned:  Ladene Artist, MD, [facility] 01/23/2014 9:34 AM

## 2014-01-23 NOTE — Progress Notes (Signed)
Called to room to assist during endoscopic procedure.  Patient ID and intended procedure confirmed with present staff. Received instructions for my participation in the procedure from the performing physician.  

## 2014-01-27 ENCOUNTER — Telehealth: Payer: Self-pay | Admitting: *Deleted

## 2014-01-27 NOTE — Telephone Encounter (Signed)
No identifier, left message, follow-up  

## 2014-02-01 ENCOUNTER — Encounter: Payer: Self-pay | Admitting: Gastroenterology

## 2014-03-07 ENCOUNTER — Encounter (HOSPITAL_BASED_OUTPATIENT_CLINIC_OR_DEPARTMENT_OTHER): Payer: Self-pay | Admitting: Emergency Medicine

## 2014-03-07 ENCOUNTER — Emergency Department (HOSPITAL_BASED_OUTPATIENT_CLINIC_OR_DEPARTMENT_OTHER): Payer: Medicare Other

## 2014-03-07 ENCOUNTER — Emergency Department (HOSPITAL_BASED_OUTPATIENT_CLINIC_OR_DEPARTMENT_OTHER)
Admission: EM | Admit: 2014-03-07 | Discharge: 2014-03-07 | Disposition: A | Discharge status: Home / Self Care | Payer: Medicare Other | Attending: Emergency Medicine | Admitting: Emergency Medicine

## 2014-03-07 DIAGNOSIS — Y9389 Activity, other specified: Secondary | ICD-10-CM | POA: Diagnosis not present

## 2014-03-07 DIAGNOSIS — Y9289 Other specified places as the place of occurrence of the external cause: Secondary | ICD-10-CM | POA: Diagnosis not present

## 2014-03-07 DIAGNOSIS — S0101XA Laceration without foreign body of scalp, initial encounter: Secondary | ICD-10-CM | POA: Diagnosis present

## 2014-03-07 DIAGNOSIS — Z8719 Personal history of other diseases of the digestive system: Secondary | ICD-10-CM | POA: Diagnosis not present

## 2014-03-07 DIAGNOSIS — F419 Anxiety disorder, unspecified: Secondary | ICD-10-CM | POA: Diagnosis not present

## 2014-03-07 DIAGNOSIS — Z9889 Other specified postprocedural states: Secondary | ICD-10-CM | POA: Insufficient documentation

## 2014-03-07 DIAGNOSIS — Q394 Esophageal web: Secondary | ICD-10-CM | POA: Insufficient documentation

## 2014-03-07 DIAGNOSIS — I2510 Atherosclerotic heart disease of native coronary artery without angina pectoris: Secondary | ICD-10-CM | POA: Insufficient documentation

## 2014-03-07 DIAGNOSIS — Z86010 Personal history of colonic polyps: Secondary | ICD-10-CM | POA: Diagnosis not present

## 2014-03-07 DIAGNOSIS — E780 Pure hypercholesterolemia: Secondary | ICD-10-CM | POA: Diagnosis not present

## 2014-03-07 DIAGNOSIS — S60042A Contusion of left ring finger without damage to nail, initial encounter: Secondary | ICD-10-CM | POA: Diagnosis not present

## 2014-03-07 DIAGNOSIS — Z8673 Personal history of transient ischemic attack (TIA), and cerebral infarction without residual deficits: Secondary | ICD-10-CM | POA: Diagnosis not present

## 2014-03-07 DIAGNOSIS — W1789XA Other fall from one level to another, initial encounter: Secondary | ICD-10-CM | POA: Insufficient documentation

## 2014-03-07 DIAGNOSIS — S6000XA Contusion of unspecified finger without damage to nail, initial encounter: Secondary | ICD-10-CM

## 2014-03-07 DIAGNOSIS — E119 Type 2 diabetes mellitus without complications: Secondary | ICD-10-CM | POA: Diagnosis not present

## 2014-03-07 DIAGNOSIS — Z79899 Other long term (current) drug therapy: Secondary | ICD-10-CM | POA: Insufficient documentation

## 2014-03-07 DIAGNOSIS — Z87891 Personal history of nicotine dependence: Secondary | ICD-10-CM | POA: Insufficient documentation

## 2014-03-07 DIAGNOSIS — G20 Parkinson's disease: Secondary | ICD-10-CM | POA: Insufficient documentation

## 2014-03-07 DIAGNOSIS — Z7982 Long term (current) use of aspirin: Secondary | ICD-10-CM | POA: Diagnosis not present

## 2014-03-07 HISTORY — DX: Cerebral infarction, unspecified: I63.9

## 2014-03-07 NOTE — ED Notes (Signed)
Patient was working in attic and fell through ceiling onto garage floor.C/O neck/L hand & hip pain. Laceration to back of head. L hand swollen and unable to get ring off

## 2014-03-07 NOTE — ED Provider Notes (Signed)
CSN: [id number]     Arrival date & time 03/07/14  1014 History   First MD Initiated Contact with Patient 03/07/14 1048     Chief Complaint  Patient presents with  . Fall      HPI  Patient was in his attic on his knees yesterday. He fell through the drywall. He partially caught himself on somewhat requiring before falling onto his feet, and his buttock, and in his back on his garage floor. Swelling to his left hand ring finger. Has laceration to his scalp. No loss of consciousness. No amnesia. No nausea vomiting. No blood from ears nose or mouth.  Past Medical History  Diagnosis Date  . Parkinson disease   . Hypercholesteremia   . Chest pain at rest 06/07/2011  . Hernia, hiatal   . Anxiety   . Headache(784.0)   . Diabetes mellitus   . DM (diabetes mellitus) 06/07/2011  . CVA (cerebral infarction) 06/24/2011  . CAD (coronary artery disease) 06/25/2011  . Internal hemorrhoids   . Schatzki's ring   . Hiatal hernia   . Erosive esophagitis   . Tubular adenoma of colon 12/2008  . S/P deep brain stimulator placement   . Stroke    Past Surgical History  Procedure Laterality Date  . Hand surgery    . Cardiac catheterization  06/21/2011  . Upper gastrointestinal endoscopy      SQUAMOUS MUCOSA WITH DIFFUSE EPITHELIAL HYPERPLASIA, PARAKERATOSIS,  . Deep brain stimulator placement     Family History  Problem Relation Age of Onset  . Heart failure Mother   . Cancer Father   . Colon cancer Neg Hx   . Pancreatic cancer Neg Hx   . Rectal cancer Neg Hx   . Stomach cancer Neg Hx    History  Substance Use Topics  . Smoking status: Former Smoker    Quit date: 05/22/1966  . Smokeless tobacco: Never Used  . Alcohol Use: No    Review of Systems  Constitutional: Negative for fever, chills, diaphoresis, appetite change and fatigue.  HENT: Negative for mouth sores, sore throat and trouble swallowing.        Laceration on his occipital scalp  Eyes: Negative for visual disturbance.    Respiratory: Negative for cough, chest tightness, shortness of breath and wheezing.   Cardiovascular: Negative for chest pain.  Gastrointestinal: Negative for nausea, vomiting, abdominal pain, diarrhea and abdominal distention.  Endocrine: Negative for polydipsia, polyphagia and polyuria.  Genitourinary: Negative for dysuria, frequency and hematuria.  Musculoskeletal: Negative for gait problem.       Pain soft tissue swelling of his left ring finger, distal to his wedding ring.  Skin: Negative for color change, pallor and rash.  Neurological: Negative for dizziness, syncope, light-headedness and headaches.  Hematological: Does not bruise/bleed easily.  Psychiatric/Behavioral: Negative for behavioral problems and confusion.      Allergies  Review of patient's allergies indicates no known allergies.  Home Medications   Prior to Admission medications   Medication Sig Start Date End Date Taking? Authorizing Provider  ACCU-CHEK COMFORT CURVE test strip  05/08/11   Historical Provider, MD  AMBULATORY NON FORMULARY MEDICATION Budesonide 2mg /60mL twice daily suspension 08/15/11   Ladene Artist, MD  aspirin EC 325 MG tablet Take 325 mg by mouth daily as needed. For pain     Historical Provider, MD  carbidopa-levodopa (SINEMET) 25-100 MG per tablet Take 1 tablet by mouth 3 (three) times daily.      Historical Provider, MD  gabapentin (  NEURONTIN) 300 MG capsule Take 300 mg by mouth daily. At bedtime for restless leg    Historical Provider, MD  glipiZIDE (GLUCOTROL) 5 MG tablet Take by mouth 2 (two) times daily before a meal.    Historical Provider, MD  hydroxypropyl methylcellulose (ISOPTO TEARS) 2.5 % ophthalmic solution Place 1 drop into both eyes 3 (three) times daily.    Historical Provider, MD  lisinopril (PRINIVIL,ZESTRIL) 2.5 MG tablet Take 2.5 mg by mouth daily.    Historical Provider, MD  metFORMIN (GLUMETZA) 500 MG (MOD) 24 hr tablet Take 1,000 mg by mouth 2 (two) times daily with a  meal.  06/23/11   Leonie Man, MD  omeprazole (PRILOSEC) 20 MG capsule Take 1 capsule (20 mg total) by mouth 2 (two) times daily. 03/07/13 03/07/14  Ladene Artist, MD  PARoxetine (PAXIL) 20 MG tablet Take 40 mg by mouth daily.     Historical Provider, MD  pravastatin (PRAVACHOL) 40 MG tablet Take 40 mg by mouth every evening.      Historical Provider, MD  prazosin (MINIPRESS) 2 MG capsule Take 2 mg by mouth at bedtime.    Historical Provider, MD  rOPINIRole (REQUIP) 3 MG tablet Take 2 mg by mouth 3 (three) times daily.     Historical Provider, MD   BP 153/76  Pulse 94  Temp(Src) 97.8 F (36.6 C) (Oral)  Resp 20  Ht 5\' 10"  (1.778 m)  Wt 240 lb (108.863 kg)  BMI 34.44 kg/m2  SpO2 99% Physical Exam  Constitutional: He is oriented to person, place, and time. He appears well-developed and well-nourished. No distress.  HENT:  Head: Normocephalic.    Eyes: Conjunctivae are normal. Pupils are equal, round, and reactive to light. No scleral icterus.  Neck: Normal range of motion. Neck supple. No thyromegaly present.  Cardiovascular: Normal rate and regular rhythm.  Exam reveals no gallop and no friction rub.   No murmur heard. Pulmonary/Chest: Effort normal and breath sounds normal. No respiratory distress. He has no wheezes. He has no rales.  Abdominal: Soft. Bowel sounds are normal. He exhibits no distension. There is no tenderness. There is no rebound.  Musculoskeletal: Normal range of motion.       Hands: Neurological: He is alert and oriented to person, place, and time.  Skin: Skin is warm and dry. No rash noted.  Psychiatric: He has a normal mood and affect. His behavior is normal.    ED Course  Procedures (including critical care time) Labs Review Labs Reviewed - No data to display  Imaging Review Ct Head Wo Contrast  03/07/2014   CLINICAL DATA:  Status post fall yesterday.  Laceration of the head.  EXAM: CT HEAD WITHOUT CONTRAST  CT CERVICAL SPINE WITHOUT CONTRAST   TECHNIQUE: Multidetector CT imaging of the head and cervical spine was performed following the standard protocol without intravenous contrast. Multiplanar CT image reconstructions of the cervical spine were also generated.  COMPARISON:  Head CT scan 06/21/2011.  Brain MRI 06/23/2011.  FINDINGS: CT HEAD FINDINGS  There is some cortical atrophy. Bilateral deep brain stimulators are noted. No evidence of acute intracranial abnormality including infarct, hemorrhage, mass lesion, mass effect, midline shift or abnormal extra-axial fluid collection is identified. There is no hydrocephalus or pneumocephalus. The calvarium is intact. Imaged paranasal sinuses and mastoid air cells are clear.  CT CERVICAL SPINE FINDINGS  There is no fracture or malalignment cervical spine. Multilevel cervical spondylosis with loss of disc space height advanced facet arthropathy is identified.  IMPRESSION: No acute finding head or cervical spine.  Cortical atrophy.  Multilevel cervical spondylosis.   Electronically Signed   By: Inge Rise M.D.   On: 03/07/2014 11:30   Ct Cervical Spine Wo Contrast  03/07/2014   CLINICAL DATA:  Status post fall yesterday.  Laceration of the head.  EXAM: CT HEAD WITHOUT CONTRAST  CT CERVICAL SPINE WITHOUT CONTRAST  TECHNIQUE: Multidetector CT imaging of the head and cervical spine was performed following the standard protocol without intravenous contrast. Multiplanar CT image reconstructions of the cervical spine were also generated.  COMPARISON:  Head CT scan 06/21/2011.  Brain MRI 06/23/2011.  FINDINGS: CT HEAD FINDINGS  There is some cortical atrophy. Bilateral deep brain stimulators are noted. No evidence of acute intracranial abnormality including infarct, hemorrhage, mass lesion, mass effect, midline shift or abnormal extra-axial fluid collection is identified. There is no hydrocephalus or pneumocephalus. The calvarium is intact. Imaged paranasal sinuses and mastoid air cells are clear.  CT  CERVICAL SPINE FINDINGS  There is no fracture or malalignment cervical spine. Multilevel cervical spondylosis with loss of disc space height advanced facet arthropathy is identified.  IMPRESSION: No acute finding head or cervical spine.  Cortical atrophy.  Multilevel cervical spondylosis.   Electronically Signed   By: Inge Rise M.D.   On: 03/07/2014 11:30   Dg Finger Ring Left  03/07/2014   CLINICAL DATA:  Fall through at a group yesterday with injury to left ring finger. Pain proximal over the ring finger.  EXAM: LEFT RING FINGER 2+V  COMPARISON:  None.  FINDINGS: Exam demonstrates degenerative changes of the interphalangeal joints. There is no acute fracture or dislocation.  IMPRESSION: No acute fracture.   Electronically Signed   By: Marin Olp M.D.   On: 03/07/2014 11:37     EKG Interpretation None      MDM   Final diagnoses:  Scalp laceration, initial encounter  Finger contusion, initial encounter    CT showed no acute findings. Plan is continued wound care. Primary care followup. Declines pain medications.    Tanna Furry, MD 03/07/14 1218

## 2014-03-07 NOTE — Discharge Instructions (Signed)
Keep your laceration clean, and covered with antibiotic ointment and a dressing.  Contusion A contusion is a deep bruise. Contusions happen when an injury causes bleeding under the skin. Signs of bruising include pain, puffiness (swelling), and discolored skin. The contusion may turn blue, purple, or yellow. HOME CARE   Put ice on the injured area.  Put ice in a plastic bag.  Place a towel between your skin and the bag.  Leave the ice on for 15-20 minutes, 03-04 times a day.  Only take medicine as told by your doctor.  Rest the injured area.  If possible, raise (elevate) the injured area to lessen puffiness. GET HELP RIGHT AWAY IF:   You have more bruising or puffiness.  You have pain that is getting worse.  Your puffiness or pain is not helped by medicine. MAKE SURE YOU:   Understand these instructions.  Will watch your condition.  Will get help right away if you are not doing well or get worse. Document Released: 10/25/2007 Document Revised: 07/31/2011 Document Reviewed: 03/13/2011 [facility] Patient Information 2015 [place], [place]. This information is not intended to replace advice given to you by your health care provider. Make sure you discuss any questions you have with your health care provider.  Laceration Care, Adult A laceration is a cut or lesion that goes through all layers of the skin and into the tissue just beneath the skin. TREATMENT  Some lacerations may not require closure. Some lacerations may not be able to be closed due to an increased risk of infection. It is important to see your caregiver as soon as possible after an injury to minimize the risk of infection and maximize the opportunity for successful closure. If closure is appropriate, pain medicines may be given, if needed. The wound will be cleaned to help prevent infection. Your caregiver will use stitches (sutures), staples, wound glue (adhesive), or skin adhesive strips to repair the laceration.  These tools bring the skin edges together to allow for faster healing and a better cosmetic outcome. However, all wounds will heal with a scar. Once the wound has healed, scarring can be minimized by covering the wound with sunscreen during the day for 1 full year. HOME CARE INSTRUCTIONS  For sutures or staples:  Keep the wound clean and dry.  If you were given a bandage (dressing), you should change it at least once a day. Also, change the dressing if it becomes wet or dirty, or as directed by your caregiver.  Wash the wound with soap and water 2 times a day. Rinse the wound off with water to remove all soap. Pat the wound dry with a clean towel.  After cleaning, apply a thin layer of the antibiotic ointment as recommended by your caregiver. This will help prevent infection and keep the dressing from sticking.  You may shower as usual after the first 24 hours. Do not soak the wound in water until the sutures are removed.  Only take over-the-counter or prescription medicines for pain, discomfort, or fever as directed by your caregiver.  Get your sutures or staples removed as directed by your caregiver. For skin adhesive strips:  Keep the wound clean and dry.  Do not get the skin adhesive strips wet. You may bathe carefully, using caution to keep the wound dry.  If the wound gets wet, pat it dry with a clean towel.  Skin adhesive strips will fall off on their own. You may trim the strips as the wound heals. Do  not remove skin adhesive strips that are still stuck to the wound. They will fall off in time. For wound adhesive:  You may briefly wet your wound in the shower or bath. Do not soak or scrub the wound. Do not swim. Avoid periods of heavy perspiration until the skin adhesive has fallen off on its own. After showering or bathing, gently pat the wound dry with a clean towel.  Do not apply liquid medicine, cream medicine, or ointment medicine to your wound while the skin adhesive is in  place. This may loosen the film before your wound is healed.  If a dressing is placed over the wound, be careful not to apply tape directly over the skin adhesive. This may cause the adhesive to be pulled off before the wound is healed.  Avoid prolonged exposure to sunlight or tanning lamps while the skin adhesive is in place. Exposure to ultraviolet light in the first year will darken the scar.  The skin adhesive will usually remain in place for 5 to 10 days, then naturally fall off the skin. Do not pick at the adhesive film. You may need a tetanus shot if:  You cannot remember when you had your last tetanus shot.  You have never had a tetanus shot. If you get a tetanus shot, your arm may swell, get red, and feel warm to the touch. This is common and not a problem. If you need a tetanus shot and you choose not to have one, there is a rare chance of getting tetanus. Sickness from tetanus can be serious. SEEK MEDICAL CARE IF:   You have redness, swelling, or increasing pain in the wound.  You see a red line that goes away from the wound.  You have yellowish-white fluid (pus) coming from the wound.  You have a fever.  You notice a bad smell coming from the wound or dressing.  Your wound breaks open before or after sutures have been removed.  You notice something coming out of the wound such as wood or glass.  Your wound is on your hand or foot and you cannot move a finger or toe. SEEK IMMEDIATE MEDICAL CARE IF:   Your pain is not controlled with prescribed medicine.  You have severe swelling around the wound causing pain and numbness or a change in color in your arm, hand, leg, or foot.  Your wound splits open and starts bleeding.  You have worsening numbness, weakness, or loss of function of any joint around or beyond the wound.  You develop painful lumps near the wound or on the skin anywhere on your body. MAKE SURE YOU:   Understand these instructions.  Will watch your  condition.  Will get help right away if you are not doing well or get worse. Document Released: 05/08/2005 Document Revised: 07/31/2011 Document Reviewed: 11/01/2010 [facility] Patient Information 2015 [place], [place]. This information is not intended to replace advice given to you by your health care provider. Make sure you discuss any questions you have with your health care provider.

## 2014-03-07 NOTE — ED Notes (Signed)
Left hadn ring finger swollen & unable to remove ring, used ring cutter to remove ring and ring given to wife.

## 2014-04-30 ENCOUNTER — Encounter (HOSPITAL_COMMUNITY): Payer: Self-pay | Admitting: Cardiology

## 2014-11-16 ENCOUNTER — Other Ambulatory Visit: Payer: Self-pay

## 2014-12-21 DIAGNOSIS — I10 Essential (primary) hypertension: Secondary | ICD-10-CM | POA: Diagnosis present

## 2015-09-14 ENCOUNTER — Encounter (HOSPITAL_COMMUNITY): Payer: Self-pay | Admitting: Adult Health

## 2015-09-14 ENCOUNTER — Emergency Department (HOSPITAL_COMMUNITY): Payer: Medicare Other

## 2015-09-14 ENCOUNTER — Emergency Department (HOSPITAL_COMMUNITY)
Admission: EM | Admit: 2015-09-14 | Discharge: 2015-09-14 | Disposition: A | Discharge status: Home / Self Care | Payer: Medicare Other | Attending: Emergency Medicine | Admitting: Emergency Medicine

## 2015-09-14 DIAGNOSIS — R110 Nausea: Secondary | ICD-10-CM | POA: Insufficient documentation

## 2015-09-14 DIAGNOSIS — R42 Dizziness and giddiness: Secondary | ICD-10-CM | POA: Diagnosis not present

## 2015-09-14 DIAGNOSIS — Z87738 Personal history of other specified (corrected) congenital malformations of digestive system: Secondary | ICD-10-CM | POA: Diagnosis not present

## 2015-09-14 DIAGNOSIS — E119 Type 2 diabetes mellitus without complications: Secondary | ICD-10-CM | POA: Diagnosis not present

## 2015-09-14 DIAGNOSIS — G20 Parkinson's disease: Secondary | ICD-10-CM | POA: Diagnosis not present

## 2015-09-14 DIAGNOSIS — Z8673 Personal history of transient ischemic attack (TIA), and cerebral infarction without residual deficits: Secondary | ICD-10-CM | POA: Insufficient documentation

## 2015-09-14 DIAGNOSIS — Z79899 Other long term (current) drug therapy: Secondary | ICD-10-CM | POA: Diagnosis not present

## 2015-09-14 DIAGNOSIS — Z87891 Personal history of nicotine dependence: Secondary | ICD-10-CM | POA: Diagnosis not present

## 2015-09-14 DIAGNOSIS — E7800 Pure hypercholesterolemia, unspecified: Secondary | ICD-10-CM | POA: Insufficient documentation

## 2015-09-14 DIAGNOSIS — Z7982 Long term (current) use of aspirin: Secondary | ICD-10-CM | POA: Insufficient documentation

## 2015-09-14 DIAGNOSIS — Z86018 Personal history of other benign neoplasm: Secondary | ICD-10-CM | POA: Diagnosis not present

## 2015-09-14 DIAGNOSIS — Z9889 Other specified postprocedural states: Secondary | ICD-10-CM | POA: Insufficient documentation

## 2015-09-14 DIAGNOSIS — Z7984 Long term (current) use of oral hypoglycemic drugs: Secondary | ICD-10-CM | POA: Diagnosis not present

## 2015-09-14 DIAGNOSIS — I2510 Atherosclerotic heart disease of native coronary artery without angina pectoris: Secondary | ICD-10-CM | POA: Diagnosis not present

## 2015-09-14 LAB — CBC WITH DIFFERENTIAL/PLATELET
BASOS ABS: 0 10*3/uL (ref 0.0–0.1)
BASOS PCT: 0 %
Eosinophils Absolute: 0.1 10*3/uL (ref 0.0–0.7)
Eosinophils Relative: 1 %
HEMATOCRIT: 40.2 % (ref 39.0–52.0)
Hemoglobin: 13.7 g/dL (ref 13.0–17.0)
Lymphocytes Relative: 5 %
Lymphs Abs: 0.4 10*3/uL — ABNORMAL LOW (ref 0.7–4.0)
MCH: 28.5 pg (ref 26.0–34.0)
MCHC: 34.1 g/dL (ref 30.0–36.0)
MCV: 83.6 fL (ref 78.0–100.0)
MONO ABS: 0.9 10*3/uL (ref 0.1–1.0)
Monocytes Relative: 13 %
NEUTROS ABS: 5.3 10*3/uL (ref 1.7–7.7)
NEUTROS PCT: 81 %
Platelets: 193 10*3/uL (ref 150–400)
RBC: 4.81 MIL/uL (ref 4.22–5.81)
RDW: 13.1 % (ref 11.5–15.5)
WBC: 6.6 10*3/uL (ref 4.0–10.5)

## 2015-09-14 LAB — COMPREHENSIVE METABOLIC PANEL
ALK PHOS: 92 U/L (ref 38–126)
ALT: 10 U/L — ABNORMAL LOW (ref 17–63)
ANION GAP: 11 (ref 5–15)
AST: 64 U/L — ABNORMAL HIGH (ref 15–41)
Albumin: 3.6 g/dL (ref 3.5–5.0)
BILIRUBIN TOTAL: 0.7 mg/dL (ref 0.3–1.2)
BUN: 11 mg/dL (ref 6–20)
CALCIUM: 8.6 mg/dL — AB (ref 8.9–10.3)
CO2: 23 mmol/L (ref 22–32)
Chloride: 106 mmol/L (ref 101–111)
Creatinine, Ser: 1.1 mg/dL (ref 0.61–1.24)
GLUCOSE: 140 mg/dL — AB (ref 65–99)
Potassium: 3.5 mmol/L (ref 3.5–5.1)
Sodium: 140 mmol/L (ref 135–145)
TOTAL PROTEIN: 5.9 g/dL — AB (ref 6.5–8.1)

## 2015-09-14 LAB — PROTIME-INR
INR: 1.1 (ref 0.00–1.49)
Prothrombin Time: 14.3 seconds (ref 11.6–15.2)

## 2015-09-14 LAB — I-STAT TROPONIN, ED: TROPONIN I, POC: 0.01 ng/mL (ref 0.00–0.08)

## 2015-09-14 MED ORDER — MECLIZINE HCL 25 MG PO TABS
25.0000 mg | ORAL_TABLET | Freq: Once | ORAL | Status: AC
  Administered 2015-09-14: 25 mg via ORAL
  Filled 2015-09-14: qty 1

## 2015-09-14 MED ORDER — ONDANSETRON HCL 4 MG PO TABS: 4.0000 mg | ORAL_TABLET | Freq: Three times a day (TID) | ORAL | Status: DC | PRN

## 2015-09-14 MED ORDER — ONDANSETRON 4 MG PO TBDP
8.0000 mg | ORAL_TABLET | Freq: Once | ORAL | Status: AC
  Administered 2015-09-14: 8 mg via ORAL
  Filled 2015-09-14: qty 2

## 2015-09-14 MED ORDER — MECLIZINE HCL 25 MG PO TABS: ORAL_TABLET | ORAL | Status: DC

## 2015-09-14 NOTE — ED Notes (Signed)
Presents from home with C/o dizziness. Pt went to sleep at 8 pm last night and was fine, he was not feeling well, but did not have dizziness. Dizziness began when pt woke at 2 am.  Endorses numbness and tingling of bilateral legs and weakness of bilateral legs. He is alert, oreinetd and answers all questions. Endorses headache. Speech normal for patient.  Dizziness is constant and nothing makes better.

## 2015-09-14 NOTE — ED Provider Notes (Signed)
CSN: [id number]     Arrival date & time 09/14/15  [id number] History  By signing my name below, I, Altamease Oiler, attest that this documentation has been prepared under the direction and in the presence of Rolland Porter, [place] AM Electronically Signed: Altamease Oiler, ED Scribe. 09/14/2015. 3:35 AM    Chief Complaint  Patient presents with  . Dizziness   The history is provided by the patient. No language interpreter was used.   Brought in by EMS, Kirk Rosario is a 75 y.o. male with history of CVA, Parkinson disease with a brain stimulator, CAD, hypercholesteremia, and DM who presents to the Emergency Department complaining of new and constant room-spinning dizziness with onset last night about 12:30 AM. The sensation is exacerbated by turning the head. He did not have dizziness with his previous stroke. Pt states that 3 hours ago he collapsed to the floor because he was spinning so bad and felt off balance. No head injury or LOC. Associated symptoms include nausea without vomiting. He states that maybe he had SOB at the time while collapsing but he is not sure. Pt denies headache, chest pain, vomiting, and sweating. He is not on a blood thinner apart from aspirin. His blood sugars have been elevated at home.   PCP [facility] [place]   Past Medical History  Diagnosis Date  . Parkinson disease ([place])   . Hypercholesteremia   . Chest pain at rest 06/07/2011  . Hernia, hiatal   . Anxiety   . Headache(784.0)   . Diabetes mellitus   . DM (diabetes mellitus) ([place]) 06/07/2011  . CVA (cerebral infarction) 06/24/2011  . CAD (coronary artery disease) 06/25/2011  . Internal hemorrhoids   . Schatzki's ring   . Hiatal hernia   . Erosive esophagitis   . Tubular adenoma of colon 12/2008  . S/P deep brain stimulator placement   . Stroke [facility])    Past Surgical History  Procedure Laterality Date  . Hand surgery    . Cardiac catheterization  06/21/2011  . Upper gastrointestinal endoscopy      SQUAMOUS  MUCOSA WITH DIFFUSE EPITHELIAL HYPERPLASIA, PARAKERATOSIS,  . Deep brain stimulator placement    . Left heart catheterization with coronary angiogram N/A 06/21/2011    Procedure: LEFT HEART CATHETERIZATION WITH CORONARY ANGIOGRAM;  Surgeon: Leonie Man, MD;  Location: [facility] CATH LAB;  Service: Cardiovascular;  Laterality: N/A;   Family History  Problem Relation Age of Onset  . Heart failure Mother   . Cancer Father   . Colon cancer Neg Hx   . Pancreatic cancer Neg Hx   . Rectal cancer Neg Hx   . Stomach cancer Neg Hx    Social History  Substance Use Topics  . Smoking status: Former Smoker    Quit date: 05/22/1966  . Smokeless tobacco: Never Used  . Alcohol Use: No  lives at home Lives with spouse  Review of Systems  Constitutional: Negative for diaphoresis.  Cardiovascular: Negative for chest pain.  Gastrointestinal: Positive for nausea. Negative for vomiting.  Neurological: Positive for dizziness. Negative for light-headedness.  All other systems reviewed and are negative.     Allergies  Review of patient's allergies indicates no known allergies.  Home Medications   Prior to Admission medications   Medication Sig Start Date End Date Taking? Authorizing Provider  aspirin EC 325 MG tablet Take 325 mg by mouth daily as needed. For pain    Yes Historical Provider, MD  carbidopa-levodopa (SINEMET) 25-100 MG per  tablet Take 1-2 tablets by mouth See admin instructions. Take 1 tablet 4 times a day then take 2 tablets at bedtime   Yes Historical Provider, MD  carboxymethylcellulose (REFRESH PLUS) 0.5 % SOLN Place 1 drop into both eyes 4 (four) times daily.   Yes Historical Provider, MD  cholecalciferol (VITAMIN D) 1000 units tablet Take 1,000 Units by mouth daily.   Yes Historical Provider, MD  glipiZIDE (GLUCOTROL) 5 MG tablet Take by mouth 2 (two) times daily before a meal.   Yes Historical Provider, MD  lisinopril (PRINIVIL,ZESTRIL) 40 MG tablet Take 40 mg by mouth daily.    Yes Historical Provider, MD  metFORMIN (GLUCOPHAGE) 1000 MG tablet Take 1,000 mg by mouth 2 (two) times daily with a meal.   Yes Historical Provider, MD  omeprazole (PRILOSEC) 20 MG capsule Take 1 capsule (20 mg total) by mouth 2 (two) times daily. 03/07/13 09/14/15 Yes Ladene Artist, MD  PARoxetine (PAXIL) 20 MG tablet Take 40 mg by mouth daily.    Yes Historical Provider, MD  pravastatin (PRAVACHOL) 40 MG tablet Take 40 mg by mouth every evening.     Yes Historical Provider, MD  prazosin (MINIPRESS) 2 MG capsule Take 6 mg by mouth at bedtime.    Yes Historical Provider, MD  rOPINIRole (REQUIP) 2 MG tablet Take 2 mg by mouth 3 (three) times daily.   Yes Historical Provider, MD  ACCU-CHEK COMFORT CURVE test strip  05/08/11   Historical Provider, MD  AMBULATORY NON FORMULARY MEDICATION Budesonide 2mg /69mL twice daily suspension Patient not taking: Reported on 09/14/2015 08/15/11   Ladene Artist, MD  meclizine (ANTIVERT) 25 MG tablet Take 1 or 2 po Q 6hrs for dizziness 09/14/15   Rolland Porter, MD  ondansetron (ZOFRAN) 4 MG tablet Take 1 tablet (4 mg total) by mouth every 8 (eight) hours as needed for nausea or vomiting (spinning). 09/14/15   Rolland Porter, MD   BP 110/52 mmHg  Pulse 84  Temp(Src) 97.9 F (36.6 C) (Oral)  Resp 18  SpO2 94%  Vital signs normal   Physical Exam  Constitutional: He is oriented to person, place, and time. He appears well-developed and well-nourished.  Non-toxic appearance. He does not appear ill. No distress.  HENT:  Head: Normocephalic and atraumatic.  Right Ear: External ear normal.  Left Ear: External ear normal.  Nose: Nose normal. No mucosal edema or rhinorrhea.  Mouth/Throat: Oropharynx is clear and moist and mucous membranes are normal. No dental abscesses or uvula swelling.  Eyes: Conjunctivae and EOM are normal. Pupils are equal, round, and reactive to light.  Neck: Normal range of motion and full passive range of motion without pain. Neck supple.   Cardiovascular: Normal rate, regular rhythm and normal heart sounds.  Exam reveals no gallop and no friction rub.   No murmur heard. Pulmonary/Chest: Effort normal and breath sounds normal. No respiratory distress. He has no wheezes. He has no rhonchi. He has no rales. He exhibits no tenderness and no crepitus.  Abdominal: Soft. Normal appearance and bowel sounds are normal. He exhibits no distension. There is no tenderness. There is no rebound and no guarding.  Musculoskeletal: Normal range of motion. He exhibits no edema or tenderness.  Moves all extremities well.   Neurological: He is alert and oriented to person, place, and time. He has normal strength. No cranial nerve deficit.  Grips equal No pronator drift No LE weakness Coordinated on finger to nose  Skin: Skin is warm, dry and intact. No  rash noted. No erythema. No pallor.  Psychiatric: He has a normal mood and affect. His speech is normal and behavior is normal. His mood appears not anxious.  Nursing note and vitals reviewed.   ED Course  Procedures (including critical care time) Medications  meclizine (ANTIVERT) tablet 25 mg (25 mg Oral Given 09/14/15 0351)  ondansetron (ZOFRAN-ODT) disintegrating tablet 8 mg (8 mg Oral Given 09/14/15 0351)    DIAGNOSTIC STUDIES: Oxygen Saturation is 94% on RA, adequate by my interpretation.    COORDINATION OF CARE: 3:31 AM Discussed treatment plan which includes lab work, CT head without contrast, EKG, Antivert, and Zofran with pt at bedside and pt agreed to plan.  Patient is unable to have MRI due to his brain stimulator. MRI would be a better test to look at his posterior fossa for possible stroke or ischemic event. CT scan was ordered instead. Patient was given Antivert and Zofran.  Recheck at 5 AM patient states he's feeling better. I'm going to have nursing staff see if he's able to stand up and ambulate in his room.  6:30 AM patient was stood up and ambulated by nursing staff. He  normally has a shuffling gait which he has now. He states his dizziness and the spinning did not return, he also does not feel off balance anymore. At this point he was felt to be stable to be discharged home.    Labs Review Results for orders placed or performed during the hospital encounter of 09/14/15  Comprehensive metabolic panel  Result Value Ref Range   Sodium 140 135 - 145 mmol/L   Potassium 3.5 3.5 - 5.1 mmol/L   Chloride 106 101 - 111 mmol/L   CO2 23 22 - 32 mmol/L   Glucose, Bld 140 (H) 65 - 99 mg/dL   BUN 11 6 - 20 mg/dL   Creatinine, Ser 1.10 0.61 - 1.24 mg/dL   Calcium 8.6 (L) 8.9 - 10.3 mg/dL   Total Protein 5.9 (L) 6.5 - 8.1 g/dL   Albumin 3.6 3.5 - 5.0 g/dL   AST 64 (H) 15 - 41 U/L   ALT 10 (L) 17 - 63 U/L   Alkaline Phosphatase 92 38 - 126 U/L   Total Bilirubin 0.7 0.3 - 1.2 mg/dL   GFR calc non Af Amer >60 >60 mL/min   GFR calc Af Amer >60 >60 mL/min   Anion gap 11 5 - 15  CBC with Differential  Result Value Ref Range   WBC 6.6 4.0 - 10.5 K/uL   RBC 4.81 4.22 - 5.81 MIL/uL   Hemoglobin 13.7 13.0 - 17.0 g/dL   HCT 40.2 39.0 - 52.0 %   MCV 83.6 78.0 - 100.0 fL   MCH 28.5 26.0 - 34.0 pg   MCHC 34.1 30.0 - 36.0 g/dL   RDW 13.1 11.5 - 15.5 %   Platelets 193 150 - 400 K/uL   Neutrophils Relative % 81 %   Neutro Abs 5.3 1.7 - 7.7 K/uL   Lymphocytes Relative 5 %   Lymphs Abs 0.4 (L) 0.7 - 4.0 K/uL   Monocytes Relative 13 %   Monocytes Absolute 0.9 0.1 - 1.0 K/uL   Eosinophils Relative 1 %   Eosinophils Absolute 0.1 0.0 - 0.7 K/uL   Basophils Relative 0 %   Basophils Absolute 0.0 0.0 - 0.1 K/uL  Protime-INR  Result Value Ref Range   Prothrombin Time 14.3 11.6 - 15.2 seconds   INR 1.10 0.00 - 1.49  I-stat troponin, ED  Result Value Ref Range   Troponin i, poc 0.01 0.00 - 0.08 ng/mL   Comment 3           Laboratory interpretation all normal   Ct Head Wo Contrast  09/14/2015  CLINICAL DATA:  Acute onset of dizziness and vertigo. EXAM: CT HEAD WITHOUT  CONTRAST TECHNIQUE: Contiguous axial images were obtained from the base of the skull through the vertex without intravenous contrast. COMPARISON:  03/07/2014 FINDINGS: Skull and Sinuses:Negative for fracture or destructive process. Bur holes for bilateral deep brain stimulator. No visualized mastoiditis or acute sinusitis. Visualized orbits: Negative. Brain: No evidence of acute infarction, hemorrhage, hydrocephalus, or mass lesion/mass effect. Unremarkable appearance of bilateral deep brain stimulator. Generalized cerebral volume loss which is normal for age and stable. IMPRESSION: No acute finding or change since 2015. Electronically Signed   By: Monte Fantasia M.D.   On: 09/14/2015 04:31    I have personally reviewed and evaluated these images and lab results as part of my medical decision-making.   EKG Interpretation   Date/Time:  Tuesday September 14 2015 02:52:14 EDT Ventricular Rate:  84 PR Interval:  189 QRS Duration: 98 QT Interval:  451 QTC Calculation: 533 R Axis:   47 Text Interpretation:  Sinus rhythm Consider left atrial enlargement  Abnormal R-wave progression, early transition Since last tracing 21 Jun 2011 Nonspecific repol abnormality, diffuse leads Minimal ST elevation,  lateral leads Prolonged QT interval Artifact in lead(s) I II III aVR aVL  aVF V1 V3 V4 V5 V6 Confirmed by Micai Apolinar  MD-I, Aloysius Heinle ([id number]) on 09/14/2015  3:00:38 AM      MDM   Final diagnoses:  Vertigo    New Prescriptions   MECLIZINE (ANTIVERT) 25 MG TABLET    Take 1 or 2 po Q 6hrs for dizziness   ONDANSETRON (ZOFRAN) 4 MG TABLET    Take 1 tablet (4 mg total) by mouth every 8 (eight) hours as needed for nausea or vomiting (spinning).    Plan discharge  Rolland Porter, MD, Barbette Or, MD 09/14/15 [phone]

## 2015-09-14 NOTE — Discharge Instructions (Signed)
Use the meclizine for dizziness and the zofran for nausea, it will also help with the dizziness. Recheck if you get worse such as a headache, you are falling and unable to walk or you get worse with uncontrolled dizziness or vomiting.  Vertigo Vertigo means you feel like you or your surroundings are moving when they are not. Vertigo can be dangerous if it occurs when you are at work, driving, or performing difficult activities.  CAUSES  Vertigo occurs when there is a conflict of signals sent to your brain from the visual and sensory systems in your body. There are many different causes of vertigo, including:  Infections, especially in the inner ear.  A bad reaction to a drug or misuse of alcohol and medicines.  Withdrawal from drugs or alcohol.  Rapidly changing positions, such as lying down or rolling over in bed.  A migraine headache.  Decreased blood flow to the brain.  Increased pressure in the brain from a head injury, infection, tumor, or bleeding. SYMPTOMS  You may feel as though the world is spinning around or you are falling to the ground. Because your balance is upset, vertigo can cause nausea and vomiting. You may have involuntary eye movements (nystagmus). DIAGNOSIS  Vertigo is usually diagnosed by physical exam. If the cause of your vertigo is unknown, your caregiver may perform imaging tests, such as an MRI scan (magnetic resonance imaging). TREATMENT  Most cases of vertigo resolve on their own, without treatment. Depending on the cause, your caregiver may prescribe certain medicines. If your vertigo is related to body position issues, your caregiver may recommend movements or procedures to correct the problem. In rare cases, if your vertigo is caused by certain inner ear problems, you may need surgery. HOME CARE INSTRUCTIONS   Follow your caregiver's instructions.  Avoid driving.  Avoid operating heavy machinery.  Avoid performing any tasks that would be dangerous to  you or others during a vertigo episode.  Tell your caregiver if you notice that certain medicines seem to be causing your vertigo. Some of the medicines used to treat vertigo episodes can actually make them worse in some people. SEEK IMMEDIATE MEDICAL CARE IF:   Your medicines do not relieve your vertigo or are making it worse.  You develop problems with talking, walking, weakness, or using your arms, hands, or legs.  You develop severe headaches.  Your nausea or vomiting continues or gets worse.  You develop visual changes.  A family member notices behavioral changes.  Your condition gets worse. MAKE SURE YOU:  Understand these instructions.  Will watch your condition.  Will get help right away if you are not doing well or get worse.   This information is not intended to replace advice given to you by your health care provider. Make sure you discuss any questions you have with your health care provider.   Document Released: 02/15/2005 Document Revised: 07/31/2011 Document Reviewed: 08/31/2014 Elsevier Interactive Patient Education [employer].

## 2015-09-14 NOTE — ED Notes (Signed)
Pt.is very unsteady when waking from the bed to the door,pt.was [employer] 

## 2016-11-02 ENCOUNTER — Emergency Department (HOSPITAL_BASED_OUTPATIENT_CLINIC_OR_DEPARTMENT_OTHER)
Admission: EM | Admit: 2016-11-02 | Discharge: 2016-11-02 | Disposition: A | Discharge status: Home / Self Care | Payer: Medicare Other | Attending: Emergency Medicine | Admitting: Emergency Medicine

## 2016-11-02 ENCOUNTER — Encounter (HOSPITAL_BASED_OUTPATIENT_CLINIC_OR_DEPARTMENT_OTHER): Payer: Self-pay | Admitting: Emergency Medicine

## 2016-11-02 DIAGNOSIS — Z969 Presence of functional implant, unspecified: Secondary | ICD-10-CM | POA: Diagnosis not present

## 2016-11-02 DIAGNOSIS — Z79899 Other long term (current) drug therapy: Secondary | ICD-10-CM | POA: Insufficient documentation

## 2016-11-02 DIAGNOSIS — I2510 Atherosclerotic heart disease of native coronary artery without angina pectoris: Secondary | ICD-10-CM | POA: Diagnosis not present

## 2016-11-02 DIAGNOSIS — Z7984 Long term (current) use of oral hypoglycemic drugs: Secondary | ICD-10-CM | POA: Diagnosis not present

## 2016-11-02 DIAGNOSIS — Z87891 Personal history of nicotine dependence: Secondary | ICD-10-CM | POA: Diagnosis not present

## 2016-11-02 DIAGNOSIS — G20 Parkinson's disease: Secondary | ICD-10-CM | POA: Insufficient documentation

## 2016-11-02 DIAGNOSIS — E119 Type 2 diabetes mellitus without complications: Secondary | ICD-10-CM | POA: Insufficient documentation

## 2016-11-02 DIAGNOSIS — R42 Dizziness and giddiness: Secondary | ICD-10-CM

## 2016-11-02 LAB — CBC WITH DIFFERENTIAL/PLATELET
Basophils Absolute: 0 10*3/uL (ref 0.0–0.1)
Basophils Relative: 0 %
EOS PCT: 3 %
Eosinophils Absolute: 0.2 10*3/uL (ref 0.0–0.7)
HEMATOCRIT: 41 % (ref 39.0–52.0)
Hemoglobin: 14.4 g/dL (ref 13.0–17.0)
LYMPHS ABS: 1.1 10*3/uL (ref 0.7–4.0)
LYMPHS PCT: 16 %
MCH: 30.7 pg (ref 26.0–34.0)
MCHC: 35.1 g/dL (ref 30.0–36.0)
MCV: 87.4 fL (ref 78.0–100.0)
MONO ABS: 0.5 10*3/uL (ref 0.1–1.0)
Monocytes Relative: 8 %
Neutro Abs: 4.9 10*3/uL (ref 1.7–7.7)
Neutrophils Relative %: 73 %
PLATELETS: 222 10*3/uL (ref 150–400)
RBC: 4.69 MIL/uL (ref 4.22–5.81)
RDW: 13.4 % (ref 11.5–15.5)
WBC: 6.7 10*3/uL (ref 4.0–10.5)

## 2016-11-02 LAB — BASIC METABOLIC PANEL
Anion gap: 10 (ref 5–15)
BUN: 20 mg/dL (ref 6–20)
CHLORIDE: 108 mmol/L (ref 101–111)
CO2: 23 mmol/L (ref 22–32)
Calcium: 9.1 mg/dL (ref 8.9–10.3)
Creatinine, Ser: 1.21 mg/dL (ref 0.61–1.24)
GFR calc Af Amer: >60 mL/min (ref >60)
GFR calc non Af Amer: 58 mL/min — ABNORMAL LOW (ref >60)
GLUCOSE: 147 mg/dL — AB (ref 65–99)
POTASSIUM: 4.2 mmol/L (ref 3.5–5.1)
Sodium: 141 mmol/L (ref 135–145)

## 2016-11-02 LAB — URINALYSIS, ROUTINE W REFLEX MICROSCOPIC
Glucose, UA: NEGATIVE mg/dL
HGB URINE DIPSTICK: NEGATIVE
KETONES UR: 15 mg/dL — AB
Leukocytes, UA: NEGATIVE
Nitrite: NEGATIVE
PH: 6 (ref 5.0–8.0)
PROTEIN: NEGATIVE mg/dL
Specific Gravity, Urine: 1.028 (ref 1.005–1.030)

## 2016-11-02 LAB — CBG MONITORING, ED: Glucose-Capillary: 166 mg/dL — ABNORMAL HIGH (ref 65–99)

## 2016-11-02 MED ORDER — SODIUM CHLORIDE 0.9 % IV BOLUS (SEPSIS)
1000.0000 mL | Freq: Once | INTRAVENOUS | Status: AC
  Administered 2016-11-02: 1000 mL via INTRAVENOUS

## 2016-11-02 NOTE — Discharge Instructions (Signed)
Continue your medications as before.  Follow-up with your primary Dr. in the next week, and return to the emergency department if symptoms significantly worsen or change.

## 2016-11-02 NOTE — ED Notes (Addendum)
Pt reports feeling increased dizziness when standing up and exerting himself. Pt denies dizziness when resting. Pt contacted PCP and informed to been seen in the ER. Wife reports pt was "orthostatic at home".

## 2016-11-02 NOTE — ED Triage Notes (Signed)
Pt with hx of parkinson's disease, pt has deep brain stimulator in place. Pt c/o dizziness since 8:00 this morning. Denies pain. Pt denies abnormal weakness in arms/legs.

## 2016-11-02 NOTE — ED Provider Notes (Signed)
[facility] DEPT [facility] Provider Note   CSN: [id number] Arrival date & time: 11/02/16  1429     History   Chief Complaint Chief Complaint  Patient presents with  . Dizziness    HPI Kirk Rosario is a 76 y.o. male.  Patient is a 76 year old male with past medical history of Parkinson's disease with deep brain stimulator. He presents today for evaluation of dizziness. This started this morning and he describes it as a spinning sensation. It occurs when he stands and tries to walk. He denies any headache, chest pain, palpitations, or shortness of breath. He denies fevers or chills, or other complaints.   The history is provided by the patient and the spouse.  Dizziness  Quality:  Head spinning and imbalance Severity:  Moderate Onset quality:  Sudden Duration:  8 hours Timing:  Intermittent Progression:  Unchanged Chronicity:  New Context: standing up   Context: not with loss of consciousness   Relieved by:  Being still and lying down Worsened by:  Standing up Ineffective treatments:  None tried   Past Medical History:  Diagnosis Date  . Anxiety   . CAD (coronary artery disease) 06/25/2011  . Chest pain at rest 06/07/2011  . CVA (cerebral infarction) 06/24/2011  . Diabetes mellitus   . DM (diabetes mellitus) ([place]) 06/07/2011  . Erosive esophagitis   . Headache(784.0)   . Hernia, hiatal   . Hiatal hernia   . Hypercholesteremia   . Internal hemorrhoids   . Parkinson disease ([place])   . S/P deep brain stimulator placement   . Schatzki's ring   . Stroke ([place])   . Tubular adenoma of colon 12/2008    Patient Active Problem List   Diagnosis Date Noted  . CAD (coronary artery disease) 06/25/2011  . CVA (cerebral infarction), 06/21/11 06/24/2011  . Parkinson's disease 06/21/2011    Class: Chronic  . Hyperlipidemia 06/21/2011    Class: Chronic  . Confusion, postoperative,altered mental status 06/21/2011    Class: Acute  . Chest pain at rest , negative troponins, patent  coronary arteries 1999 and neg. stress myoview 08/2010 06/07/2011  . DM (diabetes mellitus) ([place]) 06/07/2011    Past Surgical History:  Procedure Laterality Date  . CARDIAC CATHETERIZATION  06/21/2011  . DEEP BRAIN STIMULATOR PLACEMENT    . HAND SURGERY    . LEFT HEART CATHETERIZATION WITH CORONARY ANGIOGRAM N/A 06/21/2011   Procedure: LEFT HEART CATHETERIZATION WITH CORONARY ANGIOGRAM;  Surgeon: Leonie Man, MD;  Location: [facility] CATH LAB;  Service: Cardiovascular;  Laterality: N/A;  . UPPER GASTROINTESTINAL ENDOSCOPY     SQUAMOUS MUCOSA WITH DIFFUSE EPITHELIAL HYPERPLASIA, PARAKERATOSIS,       Home Medications    Prior to Admission medications   Medication Sig Start Date End Date Taking? Authorizing Provider  amLODipine (NORVASC) 5 MG tablet Take 5 mg by mouth daily.   Yes [provider]  aspirin EC 325 MG tablet Take 325 mg by mouth daily as needed. For pain    Yes [provider]  carbidopa-levodopa (SINEMET) 25-100 MG per tablet Take 1-2 tablets by mouth See admin instructions. Take 1 tablet 4 times a day then take 2 tablets at bedtime   Yes [provider]  glipiZIDE (GLUCOTROL) 5 MG tablet Take by mouth 2 (two) times daily before a meal.   Yes [provider]  lisinopril (PRINIVIL,ZESTRIL) 40 MG tablet Take 40 mg by mouth daily.   Yes [provider]  metFORMIN (GLUCOPHAGE) 1000 MG tablet Take 1,000  mg by mouth 2 (two) times daily with a meal.   Yes [provider]  PARoxetine (PAXIL) 20 MG tablet Take 40 mg by mouth daily.    Yes [provider]  pravastatin (PRAVACHOL) 40 MG tablet Take 40 mg by mouth every evening.     Yes [provider]  prazosin (MINIPRESS) 2 MG capsule Take 6 mg by mouth at bedtime.    Yes [provider]  ACCU-CHEK COMFORT CURVE test strip  05/08/11   [provider]  AMBULATORY NON FORMULARY MEDICATION Budesonide 2mg /80mL twice daily suspension Patient not taking:  Reported on 09/14/2015 08/15/11   Ladene Artist, MD  carboxymethylcellulose (REFRESH PLUS) 0.5 % SOLN Place 1 drop into both eyes 4 (four) times daily.    [provider]  cholecalciferol (VITAMIN D) 1000 units tablet Take 1,000 Units by mouth daily.    [provider]  meclizine (ANTIVERT) 25 MG tablet Take 1 or 2 po Q 6hrs for dizziness 09/14/15   Rolland Porter, MD  omeprazole (PRILOSEC) 20 MG capsule Take 1 capsule (20 mg total) by mouth 2 (two) times daily. 03/07/13 09/14/15  Ladene Artist, MD  ondansetron (ZOFRAN) 4 MG tablet Take 1 tablet (4 mg total) by mouth every 8 (eight) hours as needed for nausea or vomiting (spinning). 09/14/15   Rolland Porter, MD  rOPINIRole (REQUIP) 2 MG tablet Take 2 mg by mouth 3 (three) times daily.    [provider]    Family History Family History  Problem Relation Age of Onset  . Heart failure Mother   . Cancer Father   . Colon cancer Neg Hx   . Pancreatic cancer Neg Hx   . Rectal cancer Neg Hx   . Stomach cancer Neg Hx     Social History Social History  Substance Use Topics  . Smoking status: Former Smoker    Quit date: 05/22/1966  . Smokeless tobacco: Never Used  . Alcohol use No     Allergies   Patient has no known allergies.   Review of Systems Review of Systems  Neurological: Positive for dizziness.  All other systems reviewed and are negative.    Physical Exam Updated Vital Signs BP 139/71 (BP Location: Left Arm)   Pulse 97   Temp 98.1 F (36.7 C) (Oral)   Resp 20   Ht 5' 10.5" (1.791 m)   Wt 107 kg (236 lb)   SpO2 100%   BMI 33.38 kg/m   Physical Exam  Constitutional: He is oriented to person, place, and time. He appears well-developed and well-nourished. No distress.  HENT:  Head: Normocephalic and atraumatic.  Mouth/Throat: Oropharynx is clear and moist.  Eyes: EOM are normal. Pupils are equal, round, and reactive to light.  Neck: Normal range of motion. Neck supple.  Cardiovascular:  Normal rate and regular rhythm.  Exam reveals no friction rub.   No murmur heard. Pulmonary/Chest: Effort normal and breath sounds normal. No respiratory distress. He has no wheezes. He has no rales.  Abdominal: Soft. Bowel sounds are normal. He exhibits no distension. There is no tenderness.  Musculoskeletal: Normal range of motion. He exhibits no edema.  Neurological: He is alert and oriented to person, place, and time. No cranial nerve deficit. He exhibits normal muscle tone. Coordination normal.  Skin: Skin is warm and dry. He is not diaphoretic.  Nursing note and vitals reviewed.    ED Treatments / Results  Labs (all labs ordered are listed, but only abnormal  results are displayed) Labs Reviewed  CBG MONITORING, ED - Abnormal; Notable for the following:       Result Value   Glucose-Capillary 166 (*)    All other components within normal limits  BASIC METABOLIC PANEL  CBC WITH DIFFERENTIAL/PLATELET  URINALYSIS, ROUTINE W REFLEX MICROSCOPIC    EKG  EKG Interpretation None       Radiology No results found.  Procedures Procedures (including critical care time)  Medications Ordered in ED Medications  sodium chloride 0.9 % bolus 1,000 mL (not administered)     Initial Impression / Assessment and Plan / ED Course  I have reviewed the triage vital signs and the nursing notes.  Pertinent labs & imaging results that were available during my care of the patient were reviewed by me and considered in my medical decision making (see chart for details).  Patient presents here with complaints of dizziness. He has a history of Parkinson's disease with deep brain stimulator. His physical examination is unremarkable and neurologic exam is nonfocal. He was told to come here by his primary Dr. for possible dehydration/UTI. He appears clinically well-hydrated and vital signs are stable. He was given a liter of saline, then ambulated and stated that his dizziness has improved. His  laboratory studies are unremarkable and urinalysis is clear. He will be discharged with instructions to continue medications as before and follow-up with his primary Dr. in the near future.  Final Clinical Impressions(s) / ED Diagnoses   Final diagnoses:  None    New Prescriptions New Prescriptions   No medications on file     Veryl Speak, MD 11/02/16 1711

## 2018-01-14 ENCOUNTER — Encounter (HOSPITAL_BASED_OUTPATIENT_CLINIC_OR_DEPARTMENT_OTHER): Payer: Self-pay | Admitting: *Deleted

## 2018-01-14 ENCOUNTER — Emergency Department (HOSPITAL_BASED_OUTPATIENT_CLINIC_OR_DEPARTMENT_OTHER): Payer: Medicare Other

## 2018-01-14 ENCOUNTER — Other Ambulatory Visit: Payer: Self-pay

## 2018-01-14 ENCOUNTER — Emergency Department (HOSPITAL_BASED_OUTPATIENT_CLINIC_OR_DEPARTMENT_OTHER)
Admission: EM | Admit: 2018-01-14 | Discharge: 2018-01-14 | Disposition: A | Discharge status: Home / Self Care | Payer: Medicare Other | Attending: Emergency Medicine | Admitting: Emergency Medicine

## 2018-01-14 DIAGNOSIS — Z9689 Presence of other specified functional implants: Secondary | ICD-10-CM | POA: Diagnosis not present

## 2018-01-14 DIAGNOSIS — R0781 Pleurodynia: Secondary | ICD-10-CM | POA: Diagnosis not present

## 2018-01-14 DIAGNOSIS — E119 Type 2 diabetes mellitus without complications: Secondary | ICD-10-CM | POA: Insufficient documentation

## 2018-01-14 DIAGNOSIS — I2510 Atherosclerotic heart disease of native coronary artery without angina pectoris: Secondary | ICD-10-CM | POA: Insufficient documentation

## 2018-01-14 DIAGNOSIS — Z8673 Personal history of transient ischemic attack (TIA), and cerebral infarction without residual deficits: Secondary | ICD-10-CM | POA: Insufficient documentation

## 2018-01-14 DIAGNOSIS — Y999 Unspecified external cause status: Secondary | ICD-10-CM | POA: Diagnosis not present

## 2018-01-14 DIAGNOSIS — G20 Parkinson's disease: Secondary | ICD-10-CM | POA: Insufficient documentation

## 2018-01-14 DIAGNOSIS — Y9389 Activity, other specified: Secondary | ICD-10-CM | POA: Diagnosis not present

## 2018-01-14 DIAGNOSIS — X500XXA Overexertion from strenuous movement or load, initial encounter: Secondary | ICD-10-CM | POA: Diagnosis not present

## 2018-01-14 DIAGNOSIS — Y929 Unspecified place or not applicable: Secondary | ICD-10-CM | POA: Insufficient documentation

## 2018-01-14 MED ORDER — LIDOCAINE 5 % EX PTCH: 1.0000 | MEDICATED_PATCH | CUTANEOUS | 0 refills | Status: DC

## 2018-01-14 NOTE — Discharge Instructions (Addendum)
You were evaluated in the Emergency Department and after careful evaluation, we did not find any emergent condition requiring admission or further testing in the hospital.  Your symptoms today seem to be due to musculoskeletal strain.  Please follow-up with your primary care provider if not improved in 1 week.  You can try the prescription provided for pain if desired.  Please return to the Emergency Department if you experience any worsening of your condition.  We encourage you to follow up with a primary care provider.  Thank you for allowing [place] to be a part of your care.

## 2018-01-14 NOTE — ED Triage Notes (Signed)
He was under a car a week ago and heard a pop in his left chest when he turned. Pain is no better with time.

## 2018-01-14 NOTE — ED Provider Notes (Signed)
[facility] Emergency Department Provider Note MRN:  [id number]  Arrival date & time: 01/14/18     Chief Complaint   Rib Injury   History of Present Illness   Kirk Rosario is a 77 y.o. year-old male with a history of Parkinson's disease with deep brain stimulator presenting to the ED with chief complaint of rib pain.  1 week ago, patient was laying on the floor, while performing a twisting motion, experienced some sudden onset left lateral rib pain with a popping sound/sensation.  Pain is been constant since that time, mild to moderate in severity, worse with motion, worse with palpation.  No other associated symptoms, no headache or vision change, no chest pain or shortness of breath, no abdominal pain.  No recent fevers, no bowel or bladder incontinence, no numbness or weakness in the arms or legs  Review of Systems  A complete 10 system review of systems was obtained and all systems are negative except as noted in the HPI and PMH.   Patient's Health History    Past Medical History:  Diagnosis Date  . Anxiety   . CAD (coronary artery disease) 06/25/2011  . Chest pain at rest 06/07/2011  . CVA (cerebral infarction) 06/24/2011  . Diabetes mellitus   . DM (diabetes mellitus) ([place]) 06/07/2011  . Erosive esophagitis   . Headache(784.0)   . Hernia, hiatal   . Hiatal hernia   . Hypercholesteremia   . Internal hemorrhoids   . Parkinson disease ([place])   . S/P deep brain stimulator placement   . Schatzki's ring   . Stroke ([place])   . Tubular adenoma of colon 12/2008    Past Surgical History:  Procedure Laterality Date  . CARDIAC CATHETERIZATION  06/21/2011  . DEEP BRAIN STIMULATOR PLACEMENT    . EYE SURGERY    . HAND SURGERY    . LEFT HEART CATHETERIZATION WITH CORONARY ANGIOGRAM N/A 06/21/2011   Procedure: LEFT HEART CATHETERIZATION WITH CORONARY ANGIOGRAM;  Surgeon: Leonie Man, MD;  Location: [facility] CATH LAB;  Service: Cardiovascular;  Laterality: N/A;  .  UPPER GASTROINTESTINAL ENDOSCOPY     SQUAMOUS MUCOSA WITH DIFFUSE EPITHELIAL HYPERPLASIA, PARAKERATOSIS,    Family History  Problem Relation Age of Onset  . Heart failure Mother   . Cancer Father   . Colon cancer Neg Hx   . Pancreatic cancer Neg Hx   . Rectal cancer Neg Hx   . Stomach cancer Neg Hx     Social History   Socioeconomic History  . Marital status: Married    Spouse name: Not on file  . Number of children: 3  . Years of education: Not on file  . Highest education level: Not on file  Occupational History  . Occupation: Retired    Fish farm manager: RETIRED  Social Needs  . Financial resource strain: Not on file  . Food insecurity:    Worry: Not on file    Inability: Not on file  . Transportation needs:    Medical: Not on file    Non-medical: Not on file  Tobacco Use  . Smoking status: Former Smoker    Last attempt to quit: 05/22/1966    Years since quitting: 51.6  . Smokeless tobacco: Never Used  Substance and Sexual Activity  . Alcohol use: No  . Drug use: No  . Sexual activity: Yes  Lifestyle  . Physical activity:    Days per week: Not on file    Minutes per session: Not on  file  . Stress: Not on file  Relationships  . Social connections:    Talks on phone: Not on file    Gets together: Not on file    Attends religious service: Not on file    Active member of club or organization: Not on file    Attends meetings of clubs or organizations: Not on file    Relationship status: Not on file  . Intimate partner violence:    Fear of current or ex partner: Not on file    Emotionally abused: Not on file    Physically abused: Not on file    Forced sexual activity: Not on file  Other Topics Concern  . Not on file  Social History Narrative  . Not on file     Physical Exam  Vital Signs and Nursing Notes reviewed Vitals:   01/14/18 1124 01/14/18 1314  BP: (!) 163/74 (!) 150/84  Pulse: 88 77  Resp: 18 18  Temp: 98 F (36.7 C)   SpO2: 99% 97%      CONSTITUTIONAL: Well-appearing, NAD NEURO:  Alert and oriented x 3, no focal deficits, moderate speech impairment EYES:  eyes equal and reactive ENT/NECK:  no LAD, no JVD CARDIO:   rate, well-perfused, normal S1 and S2, deep brain stimulator present subcutaneously in the right chest PULM:  CTAB no wheezing or rhonchi GI/GU:  normal bowel sounds, non-distended, non-tender MSK/SPINE:  No gross deformities, no edema SKIN:  no rash, atraumatic PSYCH:  Appropriate speech and behavior  Diagnostic and Interventional Summary    EKG Interpretation  Date/Time:    Ventricular Rate:    PR Interval:    QRS Duration:   QT Interval:    QTC Calculation:   R Axis:     Text Interpretation:        Labs Reviewed - No data to display  DG Ribs Unilateral W/Chest Left  Final Result      Medications - No data to display   Procedures Critical Care  ED Course and Medical Decision Making  I have reviewed the triage vital signs and the nursing notes.  Pertinent labs & imaging results that were available during my care of the patient were reviewed by me and considered in my medical decision making (see below for details).    Favoring musculoskeletal strain in the 77 year old male experiencing continued lateral rib pain that occurred while performing twisting motion while laying on the ground.  Abdomen is very reassuring today, nontender, specifically no splenomegaly or left upper quadrant pain.  No red flag back pain symptoms.  Provided reassurance, will follow up with PCP if not improved in 1 week.  After the discussed management above, the patient was determined to be safe for discharge.  The patient was in agreement with this plan and all questions regarding their care were answered.  ED return precautions were discussed and the patient will return to the ED with any significant worsening of condition.  Barth Kirks. [place]onia Small, [place] [email or web address]  Final Clinical Impressions(s) / ED Diagnoses     ICD-10-CM   1. Rib pain R07.81     ED Discharge Orders         Ordered    lidocaine (LIDODERM) 5 %  Every 24 hours     01/14/18 1330             Maudie Flakes, MD 01/14/18 1331

## 2018-05-24 ENCOUNTER — Encounter (HOSPITAL_BASED_OUTPATIENT_CLINIC_OR_DEPARTMENT_OTHER): Payer: Self-pay

## 2018-05-24 ENCOUNTER — Other Ambulatory Visit: Payer: Self-pay

## 2018-05-24 ENCOUNTER — Emergency Department (HOSPITAL_BASED_OUTPATIENT_CLINIC_OR_DEPARTMENT_OTHER)
Admission: EM | Admit: 2018-05-24 | Discharge: 2018-05-24 | Disposition: A | Discharge status: Home / Self Care | Payer: Medicare Other | Attending: Emergency Medicine | Admitting: Emergency Medicine

## 2018-05-24 ENCOUNTER — Emergency Department (HOSPITAL_BASED_OUTPATIENT_CLINIC_OR_DEPARTMENT_OTHER): Payer: Medicare Other

## 2018-05-24 DIAGNOSIS — I2510 Atherosclerotic heart disease of native coronary artery without angina pectoris: Secondary | ICD-10-CM | POA: Diagnosis not present

## 2018-05-24 DIAGNOSIS — R05 Cough: Secondary | ICD-10-CM | POA: Diagnosis present

## 2018-05-24 DIAGNOSIS — Z79899 Other long term (current) drug therapy: Secondary | ICD-10-CM | POA: Diagnosis not present

## 2018-05-24 DIAGNOSIS — G20 Parkinson's disease: Secondary | ICD-10-CM | POA: Insufficient documentation

## 2018-05-24 DIAGNOSIS — Z87891 Personal history of nicotine dependence: Secondary | ICD-10-CM | POA: Insufficient documentation

## 2018-05-24 DIAGNOSIS — J209 Acute bronchitis, unspecified: Secondary | ICD-10-CM | POA: Insufficient documentation

## 2018-05-24 DIAGNOSIS — Z7984 Long term (current) use of oral hypoglycemic drugs: Secondary | ICD-10-CM | POA: Diagnosis not present

## 2018-05-24 DIAGNOSIS — E119 Type 2 diabetes mellitus without complications: Secondary | ICD-10-CM | POA: Diagnosis not present

## 2018-05-24 MED ORDER — ALBUTEROL SULFATE HFA 108 (90 BASE) MCG/ACT IN AERS: 2.0000 | INHALATION_SPRAY | RESPIRATORY_TRACT | 0 refills | Status: DC | PRN

## 2018-05-24 MED ORDER — ALBUTEROL SULFATE (2.5 MG/3ML) 0.083% IN NEBU
5.0000 mg | INHALATION_SOLUTION | Freq: Once | RESPIRATORY_TRACT | Status: AC
  Administered 2018-05-24: 5 mg via RESPIRATORY_TRACT
  Filled 2018-05-24: qty 6

## 2018-05-24 MED FILL — PROAIR HFA 90 MCG INHALER: 108 (90 BAS | 17 days supply | Qty: 9 | Fill #0

## 2018-05-24 NOTE — ED Provider Notes (Signed)
[place] EMERGENCY DEPARTMENT Provider Note   CSN: [id number] Arrival date & time: 05/24/18  1236     History   Chief Complaint Chief Complaint  Patient presents with  . Cough    HPI Kirk Rosario is a 78 y.o. male.  HPI Patient is a 78 year old male with a history of Parkinson's disease who presents with productive cough over the past week.  No fevers or chills.  No history of pulmonary disease.  He does have a history of coronary artery disease and diabetes.  Denies active chest pain.  States he feels like the mucus will not come up all the way.  No bronchodilator use at home.  Eating and drinking normally.  Symptoms are mild in severity.  Denies myalgias.  No recent sick contacts   Past Medical History:  Diagnosis Date  . Anxiety   . CAD (coronary artery disease) 06/25/2011  . Chest pain at rest 06/07/2011  . CVA (cerebral infarction) 06/24/2011  . Diabetes mellitus   . DM (diabetes mellitus) ([place]) 06/07/2011  . Erosive esophagitis   . Headache(784.0)   . Hernia, hiatal   . Hiatal hernia   . Hypercholesteremia   . Internal hemorrhoids   . Parkinson disease ([place])   . S/P deep brain stimulator placement   . Schatzki's ring   . Stroke ([place])   . Tubular adenoma of colon 12/2008    Patient Active Problem List   Diagnosis Date Noted  . CAD (coronary artery disease) 06/25/2011  . CVA (cerebral infarction), 06/21/11 06/24/2011  . Parkinson's disease 06/21/2011    Class: Chronic  . Hyperlipidemia 06/21/2011    Class: Chronic  . Confusion, postoperative,altered mental status 06/21/2011    Class: Acute  . Chest pain at rest , negative troponins, patent coronary arteries 1999 and neg. stress myoview 08/2010 06/07/2011  . DM (diabetes mellitus) ([place]) 06/07/2011    Past Surgical History:  Procedure Laterality Date  . CARDIAC CATHETERIZATION  06/21/2011  . DEEP BRAIN STIMULATOR PLACEMENT    . EYE SURGERY    . HAND SURGERY    . LEFT HEART CATHETERIZATION WITH  CORONARY ANGIOGRAM N/A 06/21/2011   Procedure: LEFT HEART CATHETERIZATION WITH CORONARY ANGIOGRAM;  Surgeon: Leonie Man, MD;  Location: [facility] CATH LAB;  Service: Cardiovascular;  Laterality: N/A;  . UPPER GASTROINTESTINAL ENDOSCOPY     SQUAMOUS MUCOSA WITH DIFFUSE EPITHELIAL HYPERPLASIA, PARAKERATOSIS,        Home Medications    Prior to Admission medications   Medication Sig Start Date End Date Taking? Authorizing Provider  ACCU-CHEK COMFORT CURVE test strip  05/08/11   [provider]  albuterol (PROVENTIL HFA;VENTOLIN HFA) 108 (90 Base) MCG/ACT inhaler Inhale 2 puffs into the lungs every 4 (four) hours as needed for wheezing or shortness of breath (cough). 05/24/18   Jola Schmidt, MD  AMBULATORY NON FORMULARY MEDICATION Budesonide 2mg /67mL twice daily suspension Patient not taking: Reported on 09/14/2015 08/15/11   Ladene Artist, MD  amLODipine (NORVASC) 5 MG tablet Take 5 mg by mouth daily.    [provider]  aspirin EC 325 MG tablet Take 325 mg by mouth daily as needed. For pain     [provider]  carbidopa-levodopa (SINEMET) 25-100 MG per tablet Take 1-2 tablets by mouth See admin instructions. Take 1 tablet 4 times a day then take 2 tablets at bedtime    [provider]  carboxymethylcellulose (REFRESH PLUS) 0.5 % SOLN Place 1 drop into both eyes 4 (four)  times daily.    [provider]  cholecalciferol (VITAMIN D) 1000 units tablet Take 1,000 Units by mouth daily.    [provider]  glipiZIDE (GLUCOTROL) 5 MG tablet Take by mouth 2 (two) times daily before a meal.    [provider]  lidocaine (LIDODERM) 5 % Place 1 patch onto the skin daily. Remove & Discard patch within 12 hours or as directed by MD 01/14/18   Maudie Flakes, MD  lisinopril (PRINIVIL,ZESTRIL) 40 MG tablet Take 40 mg by mouth daily.    [provider]  meclizine (ANTIVERT) 25 MG tablet Take 1 or 2 po Q 6hrs for dizziness 09/14/15   Rolland Porter, MD  metFORMIN (GLUCOPHAGE) 1000 MG tablet Take 1,000 mg by mouth 2 (two) times daily with a meal.    [provider]  omeprazole (PRILOSEC) 20 MG capsule Take 1 capsule (20 mg total) by mouth 2 (two) times daily. 03/07/13 09/14/15  Ladene Artist, MD  ondansetron (ZOFRAN) 4 MG tablet Take 1 tablet (4 mg total) by mouth every 8 (eight) hours as needed for nausea or vomiting (spinning). 09/14/15   Rolland Porter, MD  PARoxetine (PAXIL) 20 MG tablet Take 40 mg by mouth daily.     [provider]  pravastatin (PRAVACHOL) 40 MG tablet Take 40 mg by mouth every evening.      [provider]  prazosin (MINIPRESS) 2 MG capsule Take 6 mg by mouth at bedtime.     [provider]  rOPINIRole (REQUIP) 2 MG tablet Take 2 mg by mouth 3 (three) times daily.    [provider]    Family History Family History  Problem Relation Age of Onset  . Heart failure Mother   . Cancer Father   . Colon cancer Neg Hx   . Pancreatic cancer Neg Hx   . Rectal cancer Neg Hx   . Stomach cancer Neg Hx     Social History Social History   Tobacco Use  . Smoking status: Former Smoker    Last attempt to quit: 05/22/1966    Years since quitting: 52.0  . Smokeless tobacco: Never Used  Substance Use Topics  . Alcohol use: No  . Drug use: No     Allergies   Patient has no known allergies.   Review of Systems Review of Systems  All other systems reviewed and are negative.    Physical Exam Updated Vital Signs BP (!) 146/78 (BP Location: Right Arm)   Pulse 70   Temp 98.7 F (37.1 C) (Oral)   Resp 18   Ht 5\' 10"  (1.778 m)   Wt 104.3 kg   SpO2 99%   BMI 33.00 kg/m   Physical Exam Vitals signs and nursing note reviewed.  Constitutional:      Appearance: He is well-developed.  HENT:     Head: Normocephalic and atraumatic.  Neck:     Musculoskeletal: Normal range of motion.  Cardiovascular:     Rate and Rhythm: Normal rate and regular rhythm.     Heart  sounds: Normal heart sounds.  Pulmonary:     Effort: Pulmonary effort is normal. No respiratory distress.     Breath sounds: Normal breath sounds.  Abdominal:     General: There is no distension.     Palpations: Abdomen is soft.     Tenderness: There is no abdominal tenderness.  Musculoskeletal: Normal range of motion.  Skin:    General: Skin is warm and dry.  Neurological:     Mental Status: He is alert and oriented to person, place, and time.  Psychiatric:        Judgment: Judgment normal.      ED Treatments / Results  Labs (all labs ordered are listed, but only abnormal results are displayed) Labs Reviewed - No data to display  EKG None  Radiology Dg Chest 2 View  Result Date: 05/24/2018 CLINICAL DATA:  Cough and congestion for several weeks. EXAM: CHEST - 2 VIEW COMPARISON:  01/14/2018 FINDINGS: Cardiac silhouette is normal in size and configuration. No mediastinal or hilar masses. No evidence of adenopathy. Clear lungs.  No pleural effusion or pneumothorax. Skeletal structures are intact. IMPRESSION: No active cardiopulmonary disease. Electronically Signed   By: Lajean Manes M.D.   On: 05/24/2018 13:37    Procedures Procedures (including critical care time)  Medications Ordered in ED Medications  albuterol (PROVENTIL) (2.5 MG/3ML) 0.083% nebulizer solution 5 mg (5 mg Nebulization Given 05/24/18 1339)     Initial Impression / Assessment and Plan / ED Course  I have reviewed the triage vital signs and the nursing notes.  Pertinent labs & imaging results that were available during my care of the patient were reviewed by me and considered in my medical decision making (see chart for details).     Chest x-ray without acute abnormality.  Feels much better after bronchodilators.  Suspect acute bronchitis.  No fever.  Vital signs are stable.  No indication for additional treatment at this time.  Well-appearing and feels much better.  He understands close follow-up with his  primary care physician is important and understands to return to the emergency department for new or worsening symptoms  Final Clinical Impressions(s) / ED Diagnoses   Final diagnoses:  Acute bronchitis, unspecified organism    ED Discharge Orders         Ordered    albuterol (PROVENTIL HFA;VENTOLIN HFA) 108 (90 Base) MCG/ACT inhaler  Every 4 hours PRN     05/24/18 1456           Jola Schmidt, MD 05/24/18 1505

## 2018-05-24 NOTE — ED Triage Notes (Signed)
C/o flu like sx since 12/25-NAD-to triage in w/c

## 2018-12-26 ENCOUNTER — Encounter: Payer: Self-pay | Admitting: Gastroenterology

## 2020-01-07 IMAGING — CR DG RIBS W/ CHEST 3+V*L*
3 series · 3 of 3 positions shown · non-contrast
Comparison: Prior chest x-ray 06/21/2011

CLINICAL DATA: 77-year-old male with left-sided rib pain. He felt a
pop while looking under his car approximately 3 days ago.

EXAM:
LEFT RIBS AND CHEST - 3+ VIEW

[w chest pa]
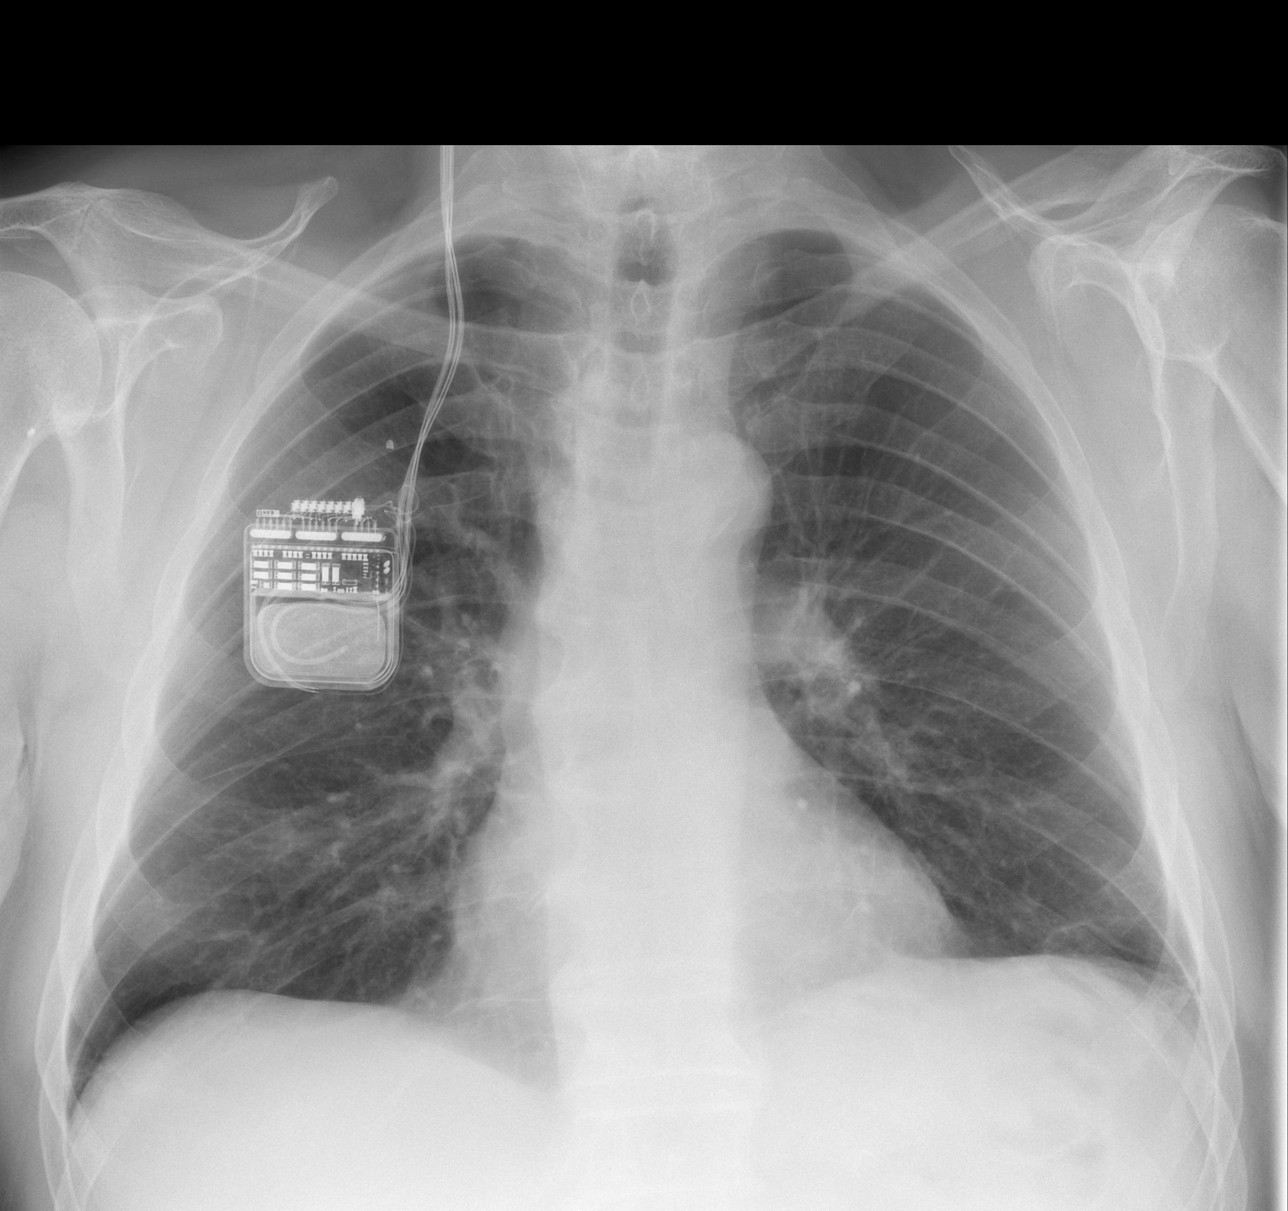

[w ribs ap/pa upper left]
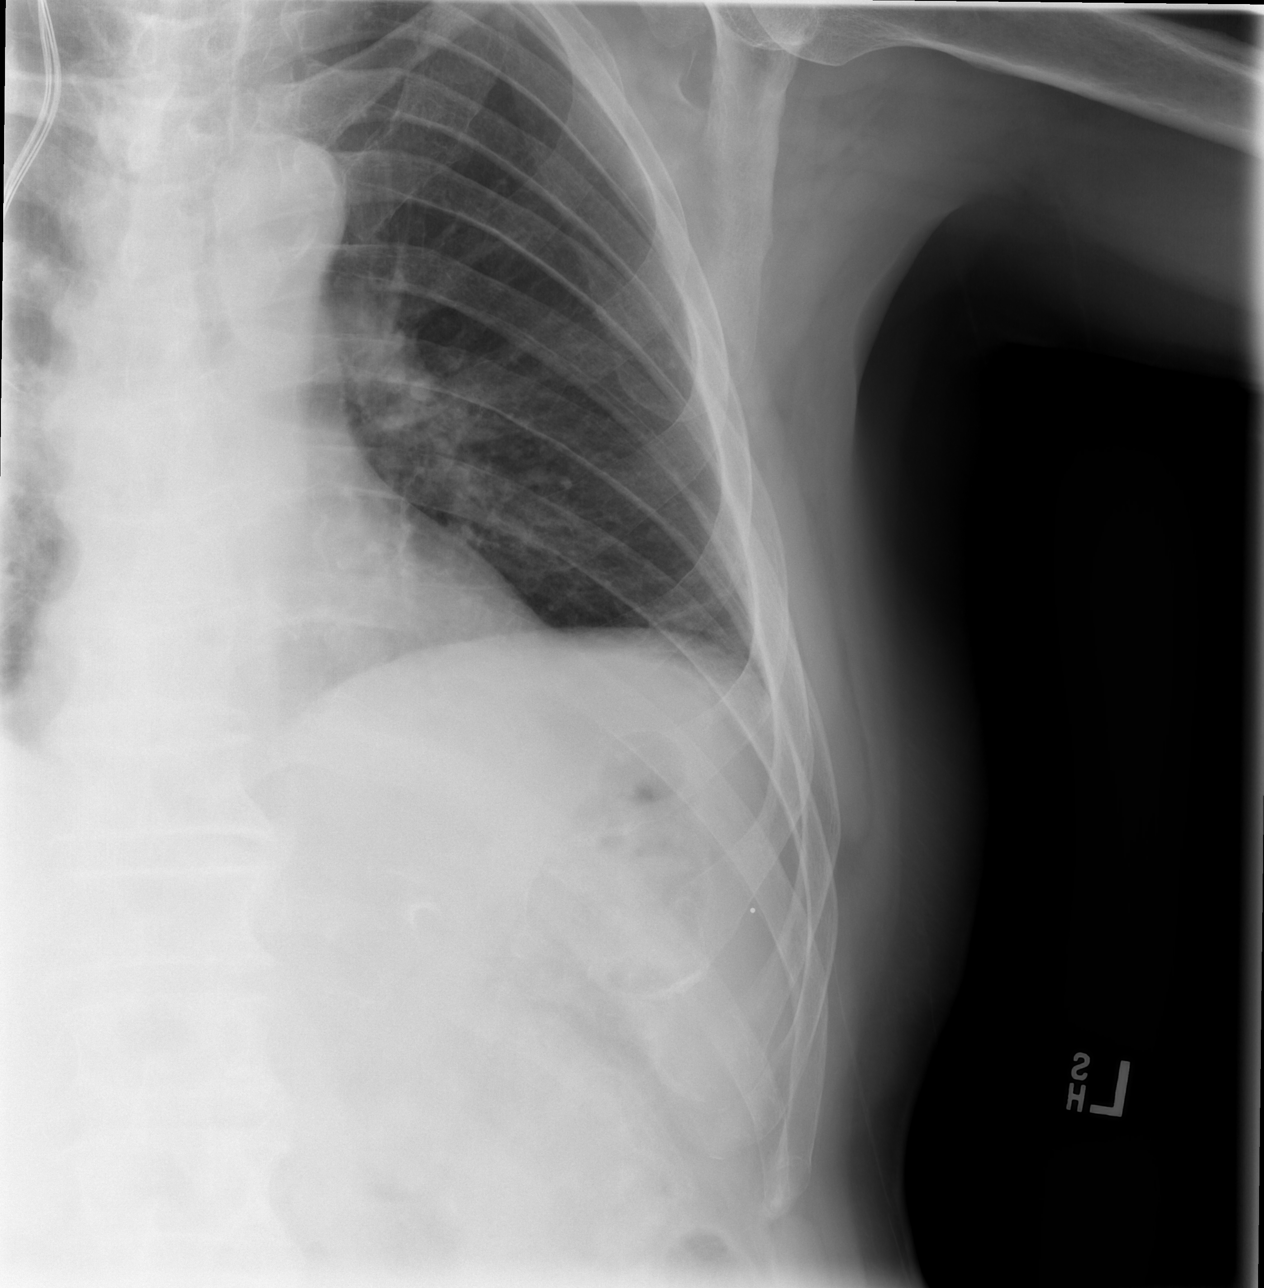

[w ribs oblique left]
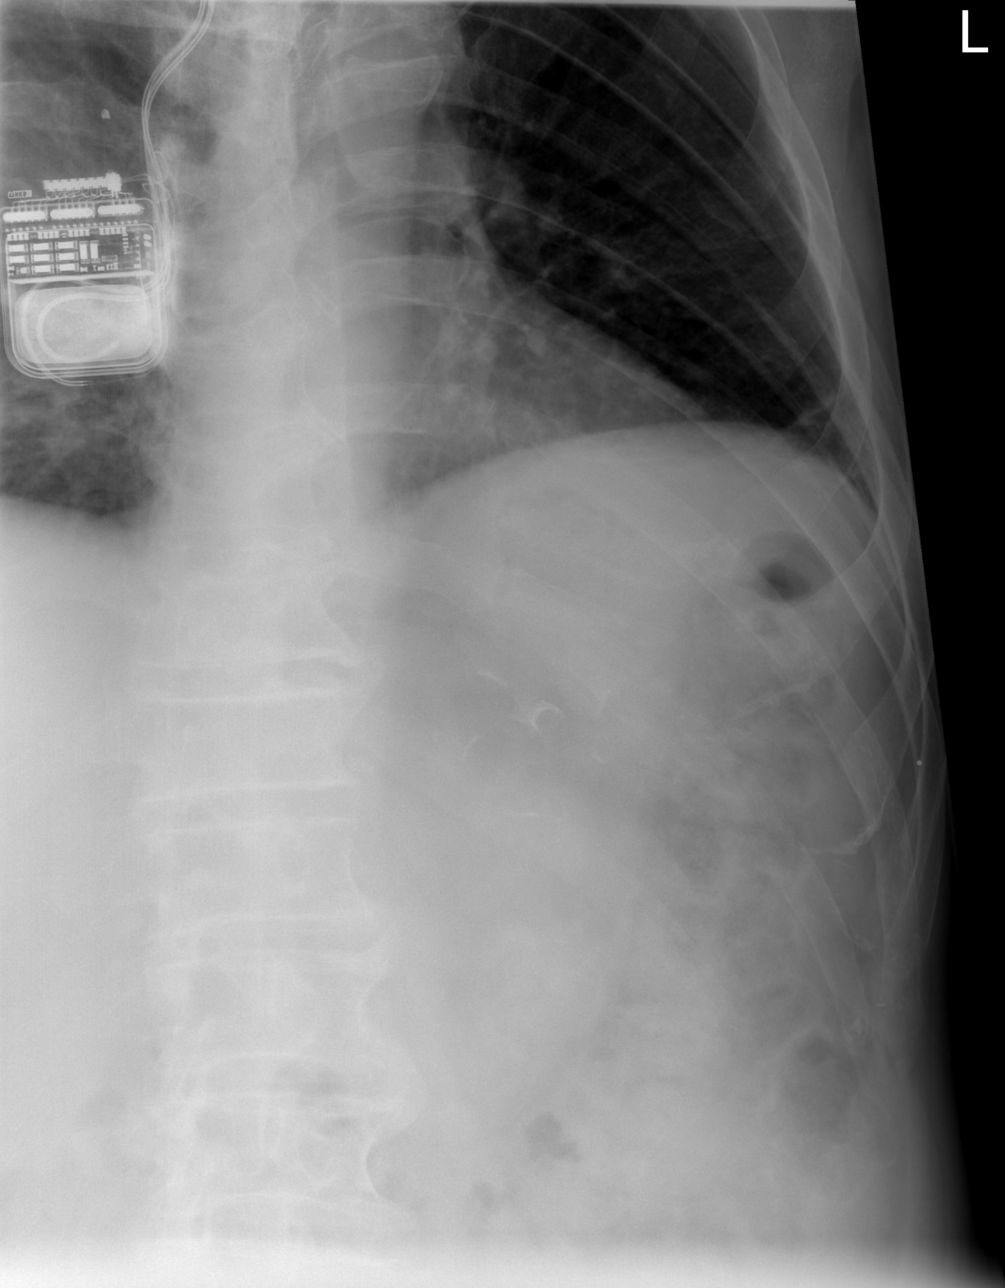

[3 of 3 positions shown; findings below may reference images not displayed]

FINDINGS: Electric generator projects over the right chest. The cardiac
mediastinal contours are within normal limits. Atherosclerotic
calcifications are noted in the transverse aorta. The lungs are
clear. No acute osseous abnormality is identified. Specifically, no
rib fracture visualized. Atherosclerotic calcifications are present
in the left upper quadrant, likely within the splenic artery.
IMPRESSION: Negative.

## 2020-08-11 DIAGNOSIS — G20A1 Parkinson's disease without dyskinesia, without mention of fluctuations: Secondary | ICD-10-CM | POA: Diagnosis present

## 2021-02-01 ENCOUNTER — Encounter: Payer: Self-pay | Admitting: Gastroenterology

## 2021-06-28 ENCOUNTER — Telehealth: Payer: Self-pay

## 2021-06-28 NOTE — Telephone Encounter (Signed)
Spoke with patient's daughter and wife and scheduled a in-person Palliative Consult for 07/07/21 @ 11 AM.   Consent obtained; updated Outlook/Netsmart/Team List and Epic.

## 2021-07-07 ENCOUNTER — Other Ambulatory Visit: Payer: Medicare Other | Admitting: Internal Medicine

## 2021-07-07 ENCOUNTER — Other Ambulatory Visit: Payer: Self-pay

## 2021-07-07 VITALS — Ht 70.0 in | Wt 224.0 lb

## 2021-07-07 DIAGNOSIS — G20 Parkinson's disease: Secondary | ICD-10-CM

## 2021-07-07 DIAGNOSIS — Z7409 Other reduced mobility: Secondary | ICD-10-CM

## 2021-07-07 DIAGNOSIS — G20A1 Parkinson's disease without dyskinesia, without mention of fluctuations: Secondary | ICD-10-CM

## 2021-07-07 DIAGNOSIS — Z515 Encounter for palliative care: Secondary | ICD-10-CM

## 2021-07-07 DIAGNOSIS — R1319 Other dysphagia: Secondary | ICD-10-CM

## 2021-07-07 DIAGNOSIS — Z789 Other specified health status: Secondary | ICD-10-CM

## 2021-07-07 NOTE — Progress Notes (Signed)
[facility] Consult Note Telephone: [phone]  Fax: [phone]   Date of encounter: 07/07/21 4:57 AM PATIENT NAME: Kirk Rosario [place] [address]   [phone] (home)  DOB: 06/09/1940 MRN: [id number] PRIMARY CARE PROVIDER:    Gerome Sam, MD at  [facility]:   [address], [facility] [place],  [place] [address] [phone]  RESPONSIBLE PARTY:    Contact Information     Name Relation Home Work Mobile   [place] Spouse [phone]          I met face to face with patient and family (wife and daughter) in his home. Palliative Care was asked to follow this patient by consultation request of  Kirk Rosario, Kirk Rosario, * to address advance care planning and complex medical decision making. This is the initial visit.                                     ASSESSMENT AND PLAN / RECOMMENDATIONS:   Advance Care Planning/Goals of Care: Goals include to maximize quality of life and symptom management. Patient/health care surrogate gave his/her permission to discuss.Our advance care planning conversation included a discussion about:    The value and importance of advance care planning  Experiences with loved ones who have been seriously ill or have died  Exploration of personal, cultural or spiritual beliefs that might influence medical decisions  Exploration of goals of care in the event of a sudden injury or illness  Identification  of a healthcare agent  Review and updating or creation of an  advance directive document . Decision not to resuscitate or to de-escalate disease focused treatments due to poor prognosis. CODE STATUS:  DNR Goal is to stay in the home as long as possible.  He has quite a bit of equipment/renovations from [place] resources.  His wife is his primary caregiver and is with him 24x7.  His daughter, Willette Alma, is concerned  about her mother getting worn out and not having time for herself.  However, Mr. Cybulski does not like Mrs to be out of his sight.  Plan is to have our team visit monthly to start having other faces in the home, and then consider introducing caregivers for a few hours eventually.  Symptom Management/Plan: 1. Parkinson's disease ([place]) -has deep brain stimulator  -does ambulate with walker, cane in the home, has power chair but "he's not a good driver" -cont 21J9 care, but try to involve outside help to give his wife a break gradually -has mix of hypophonia, dysarthria and cognitive deficits/dementia contributing to difficulty with speech -cont botox for blepharospasms and dysphagia  2. Dysphagia, neurologic -cont botox, pureed diet, aspiration precautions, monitor   3. Impaired mobility and ADLs -encouraged them to consider outside caregivers, as well; but Mrs was trained and certified by [place] as caregiver   4. Palliative care by specialist - Do not attempt resuscitation (DNR) reviewed -educated on keeping this in a place where EMS can see it for emergencies -MOST introduced and family to review.  Will connect with social work and RN/SW to f/u for next time  5. Parkinson's dementia -psychosis is significant and he has episodes of staring off -off aricept x about 1 week and on exelon patch--still having frequent loose bms (tend to come in pairs) with some fecal incontinence which is making his  care more challenging -continue depends and regular bathing and barrier cream to prevent skin breakdown -spends much of day sleeping b/w meals and then sleeps at night, as well   Follow up Palliative Care Visit: Palliative care will continue to follow for complex medical decision making, advance care planning, and clarification of goals. Return 4 weeks or prn.  This visit was coded based on medical decision making (MDM). 20 mins on ACP  PPS: weak 50% as does sleep much of day  HOSPICE  ELIGIBILITY/DIAGNOSIS: Not yet/TBD (cerebrovascular disease or PD)  Chief Complaint: new palliative care consult  HISTORY OF PRESENT ILLNESS:  Kirk Rosario is a 81 y.o. year old male  with Parkinson's disease with blepharospasm, sialorrhea, dysphagia, dysarthria, and dementia, CAD, prior stroke in 2013, DMII and hyperlipidemia. He was evaluated by the hospice team and determine not to be eligible.  Family agreed to palliative care.    Pt has parkinson's with deep brain stimulator.  He lives with his wife in a single story home and his daughter is in the connecting neighborhood.  He is a veteran Mining engineer) and has had his bathroom renovated to accommodate his needs.  He follows a pureed diet, gets botox to his throat for dysphagia and for blepharospasm of lids.  He also is bothered by light.  He has had several sets of hearing aids that he won't wear and reports they don't work (family says he doesn't like them).  He gets up, has breakfast, takes a 1-2 hr nap, has lunch if he feels like eating, then lays in the recliner or bed with the TV on but sleeps more, then has dinner (wife has to wake him for this).  He has hallucinations--sees his mother or a dog at times.  They don't scare him.  He has had difficulty with diarrhea and his aricept was stopped about a week ago due to this.  At this time, he's still having 2 loose stools per day (typically in pairs one soon after the other).  He is now using exelon patch.  He uses a cane, rollator, and power chair, they also have a wheelchair.  He has urinary incontinence at night and uses depends.  His wife wheels him to the bathroom during the day and usually he makes it for urine.  He had fecal incontinence while I was there.    History obtained from review of EMR, discussion with primary team, and interview with family, facility staff/caregiver and/or Mr. Monk.  I reviewed available labs, medications, imaging, studies and related documents from the EMR.   Records reviewed and summarized above.   ROS  General: NAD EYES: denies vision changes-blepharospasm, bothered by light ENMT: has dysphagia Cardiovascular: denies chest pain, denies DOE Pulmonary: denies cough, denies increased SOB Abdomen: endorses good appetite but eating less than he did and sometimes skips lunch, denies constipation, endorses some incontinence of bowel GU: denies dysuria, endorses incontinence of urine overnight MSK:  has increased weakness,  h/o falls Skin: denies rashes or wounds Neurological: denies pain, denies insomnia Psych: Endorses positive mood Heme/lymph/immuno: denies bruises, abnormal bleeding  Physical Exam: Current and past weights: 224 lbs at 63f 10 in with BMI 32 on 06/22/21 down from 230 lbs Jan 2020 Constitutional: NAD General: chronically ill appearing, WNWD EYES: anicteric sclera, was able to keep eyes open, but wearing dark sunglasses ENMT: hard of hearing, oral mucous membranes moist, dentition intact CV: S1S2, RRR, no LE edema Pulmonary: LCTA, no increased work of breathing, no cough,  room air Abdomen: intake 50%, normo-active BS + 4 quadrants, soft and non tender, no ascites GU: deferred MSK: no sarcopenia, moves all extremities, ambulatory witnessed with cane and with rollator, has hemiparesis Skin: warm and dry, no rashes or wounds on visible skin Neuro: has generalized weakness,  has significant cognitive impairment, dysarthria Psych: non-anxious affect, A and O to person, place, not precise on day/date/time Hem/lymph/immuno: no widespread bruising  CURRENT PROBLEM LIST:  Patient Active Problem List   Diagnosis Date Noted   CAD (coronary artery disease) 06/25/2011   CVA (cerebral infarction), 06/21/11 06/24/2011   Parkinson's disease 06/21/2011    Class: Chronic   Hyperlipidemia 06/21/2011    Class: Chronic   Confusion, postoperative,altered mental status 06/21/2011    Class: Acute   Chest pain at rest , negative troponins,  patent coronary arteries 1999 and neg. stress myoview 08/2010 06/07/2011   DM (diabetes mellitus) ([place]) 06/07/2011   PAST MEDICAL HISTORY:  Active Ambulatory Problems    Diagnosis Date Noted   Chest pain at rest , negative troponins, patent coronary arteries 1999 and neg. stress myoview 08/2010 06/07/2011   DM (diabetes mellitus) ([place]) 06/07/2011   Parkinson's disease 06/21/2011   Hyperlipidemia 06/21/2011   Confusion, postoperative,altered mental status 06/21/2011   CVA (cerebral infarction), 06/21/11 06/24/2011   CAD (coronary artery disease) 06/25/2011   Resolved Ambulatory Problems    Diagnosis Date Noted   No Resolved Ambulatory Problems   Past Medical History:  Diagnosis Date   Anxiety    Diabetes mellitus    Erosive esophagitis    Headache(784.0)    Hernia, hiatal    Hiatal hernia    Hypercholesteremia    Internal hemorrhoids    Parkinson disease (HCC)    S/P deep brain stimulator placement    Schatzki's ring    Stroke ([place])    Tubular adenoma of colon 12/2008   SOCIAL HX:  Social History   Tobacco Use   Smoking status: Former    Types: Cigarettes    Quit date: 05/22/1966    Years since quitting: 55.1   Smokeless tobacco: Never  Substance Use Topics   Alcohol use: No   FAMILY HX:  Family History  Problem Relation Age of Onset   Heart failure Mother    Cancer Father    Colon cancer Neg Hx    Pancreatic cancer Neg Hx    Rectal cancer Neg Hx    Stomach cancer Neg Hx       ALLERGIES: No Known Allergies   PERTINENT MEDICATIONS:  Outpatient Encounter Medications as of 07/07/2021  Medication Sig   ACCU-CHEK COMFORT CURVE test strip    albuterol (PROVENTIL HFA;VENTOLIN HFA) 108 (90 Base) MCG/ACT inhaler Inhale 2 puffs into the lungs every 4 (four) hours as needed for wheezing or shortness of breath (cough).   AMBULATORY NON FORMULARY MEDICATION Budesonide 2m/10mL twice daily suspension (Patient not taking: Reported on 09/14/2015)   amLODipine (NORVASC) 5 MG  tablet Take 5 mg by mouth daily.   aspirin EC 325 MG tablet Take 325 mg by mouth daily as needed. For pain    carbidopa-levodopa (SINEMET) 25-100 MG per tablet Take 1-2 tablets by mouth See admin instructions. Take 1 tablet 4 times a day then take 2 tablets at bedtime   carboxymethylcellulose (REFRESH PLUS) 0.5 % SOLN Place 1 drop into both eyes 4 (four) times daily.   cholecalciferol (VITAMIN D) 1000 units tablet Take 1,000 Units by mouth daily.   glipiZIDE (GLUCOTROL) 5 MG tablet Take  by mouth 2 (two) times daily before a meal.   lidocaine (LIDODERM) 5 % Place 1 patch onto the skin daily. Remove & Discard patch within 12 hours or as directed by MD   lisinopril (PRINIVIL,ZESTRIL) 40 MG tablet Take 40 mg by mouth daily.   meclizine (ANTIVERT) 25 MG tablet Take 1 or 2 po Q 6hrs for dizziness   metFORMIN (GLUCOPHAGE) 1000 MG tablet Take 1,000 mg by mouth 2 (two) times daily with a meal.   omeprazole (PRILOSEC) 20 MG capsule Take 1 capsule (20 mg total) by mouth 2 (two) times daily.   ondansetron (ZOFRAN) 4 MG tablet Take 1 tablet (4 mg total) by mouth every 8 (eight) hours as needed for nausea or vomiting (spinning).   PARoxetine (PAXIL) 20 MG tablet Take 40 mg by mouth daily.    pravastatin (PRAVACHOL) 40 MG tablet Take 40 mg by mouth every evening.     prazosin (MINIPRESS) 2 MG capsule Take 6 mg by mouth at bedtime.    rOPINIRole (REQUIP) 2 MG tablet Take 2 mg by mouth 3 (three) times daily.   No facility-administered encounter medications on file as of 07/07/2021.   Thank you for the opportunity to participate in the care of Mr. Kehl.  The palliative care team will continue to follow. Please call our office at [phone] if we can be of additional assistance.   Hollace Kinnier, DO   COVID-19 PATIENT SCREENING TOOL Asked and negative response unless otherwise noted:  Have you had symptoms of covid, tested positive or been in contact with someone with symptoms/positive test in the past  5-10 days? no

## 2021-07-16 ENCOUNTER — Encounter: Payer: Self-pay | Admitting: Internal Medicine

## 2022-02-27 ENCOUNTER — Emergency Department (HOSPITAL_COMMUNITY): Payer: Medicare Other

## 2022-02-27 ENCOUNTER — Other Ambulatory Visit: Payer: Self-pay

## 2022-02-27 ENCOUNTER — Observation Stay (HOSPITAL_COMMUNITY)
Admission: EM | Admit: 2022-02-27 | Discharge: 2022-02-28 | Disposition: A | Discharge status: Home / Self Care | Payer: Medicare Other | Attending: Internal Medicine | Admitting: Internal Medicine

## 2022-02-27 DIAGNOSIS — Z1152 Encounter for screening for COVID-19: Secondary | ICD-10-CM | POA: Insufficient documentation

## 2022-02-27 DIAGNOSIS — G4733 Obstructive sleep apnea (adult) (pediatric): Secondary | ICD-10-CM | POA: Insufficient documentation

## 2022-02-27 DIAGNOSIS — R0602 Shortness of breath: Secondary | ICD-10-CM | POA: Diagnosis not present

## 2022-02-27 DIAGNOSIS — R4781 Slurred speech: Secondary | ICD-10-CM | POA: Diagnosis not present

## 2022-02-27 DIAGNOSIS — R29818 Other symptoms and signs involving the nervous system: Principal | ICD-10-CM | POA: Insufficient documentation

## 2022-02-27 DIAGNOSIS — F0280 Dementia in other diseases classified elsewhere without behavioral disturbance: Secondary | ICD-10-CM | POA: Insufficient documentation

## 2022-02-27 DIAGNOSIS — Z9682 Presence of neurostimulator: Secondary | ICD-10-CM | POA: Insufficient documentation

## 2022-02-27 DIAGNOSIS — E119 Type 2 diabetes mellitus without complications: Secondary | ICD-10-CM | POA: Diagnosis not present

## 2022-02-27 DIAGNOSIS — Z79899 Other long term (current) drug therapy: Secondary | ICD-10-CM | POA: Insufficient documentation

## 2022-02-27 DIAGNOSIS — K219 Gastro-esophageal reflux disease without esophagitis: Secondary | ICD-10-CM | POA: Diagnosis present

## 2022-02-27 DIAGNOSIS — R1310 Dysphagia, unspecified: Secondary | ICD-10-CM

## 2022-02-27 DIAGNOSIS — R2990 Unspecified symptoms and signs involving the nervous system: Secondary | ICD-10-CM | POA: Diagnosis not present

## 2022-02-27 DIAGNOSIS — R404 Transient alteration of awareness: Secondary | ICD-10-CM | POA: Insufficient documentation

## 2022-02-27 DIAGNOSIS — Z7984 Long term (current) use of oral hypoglycemic drugs: Secondary | ICD-10-CM | POA: Diagnosis not present

## 2022-02-27 DIAGNOSIS — G20A1 Parkinson's disease without dyskinesia, without mention of fluctuations: Secondary | ICD-10-CM | POA: Diagnosis not present

## 2022-02-27 DIAGNOSIS — Z8673 Personal history of transient ischemic attack (TIA), and cerebral infarction without residual deficits: Secondary | ICD-10-CM | POA: Diagnosis not present

## 2022-02-27 DIAGNOSIS — Z87891 Personal history of nicotine dependence: Secondary | ICD-10-CM | POA: Insufficient documentation

## 2022-02-27 DIAGNOSIS — Z7982 Long term (current) use of aspirin: Secondary | ICD-10-CM | POA: Insufficient documentation

## 2022-02-27 DIAGNOSIS — I2510 Atherosclerotic heart disease of native coronary artery without angina pectoris: Secondary | ICD-10-CM | POA: Insufficient documentation

## 2022-02-27 DIAGNOSIS — I10 Essential (primary) hypertension: Secondary | ICD-10-CM | POA: Diagnosis not present

## 2022-02-27 DIAGNOSIS — E785 Hyperlipidemia, unspecified: Secondary | ICD-10-CM | POA: Diagnosis present

## 2022-02-27 DIAGNOSIS — F4310 Post-traumatic stress disorder, unspecified: Secondary | ICD-10-CM | POA: Diagnosis present

## 2022-02-27 DIAGNOSIS — F419 Anxiety disorder, unspecified: Secondary | ICD-10-CM | POA: Diagnosis present

## 2022-02-27 LAB — COMPREHENSIVE METABOLIC PANEL
ALT: 8 U/L (ref 0–44)
AST: 17 U/L (ref 15–41)
Albumin: 3.8 g/dL (ref 3.5–5.0)
Alkaline Phosphatase: 103 U/L (ref 38–126)
Anion gap: 10 (ref 5–15)
BUN: 16 mg/dL (ref 8–23)
CO2: 25 mmol/L (ref 22–32)
Calcium: 8.8 mg/dL — ABNORMAL LOW (ref 8.9–10.3)
Chloride: 106 mmol/L (ref 98–111)
Creatinine, Ser: 0.83 mg/dL (ref 0.61–1.24)
GFR, Estimated: >60 mL/min (ref >60)
Glucose, Bld: 126 mg/dL — ABNORMAL HIGH (ref 70–99)
Potassium: 3.9 mmol/L (ref 3.5–5.1)
Sodium: 141 mmol/L (ref 135–145)
Total Bilirubin: 0.3 mg/dL (ref 0.3–1.2)
Total Protein: 6.7 g/dL (ref 6.5–8.1)

## 2022-02-27 LAB — I-STAT CHEM 8, ED
BUN: 17 mg/dL (ref 8–23)
Calcium, Ion: 1.09 mmol/L — ABNORMAL LOW (ref 1.15–1.40)
Chloride: 107 mmol/L (ref 98–111)
Creatinine, Ser: 0.7 mg/dL (ref 0.61–1.24)
Glucose, Bld: 121 mg/dL — ABNORMAL HIGH (ref 70–99)
HCT: 42 % (ref 39.0–52.0)
Hemoglobin: 14.3 g/dL (ref 13.0–17.0)
Potassium: 4 mmol/L (ref 3.5–5.1)
Sodium: 143 mmol/L (ref 135–145)
TCO2: 25 mmol/L (ref 22–32)

## 2022-02-27 LAB — PROTIME-INR
INR: 1 (ref 0.8–1.2)
Prothrombin Time: 13.1 seconds (ref 11.4–15.2)

## 2022-02-27 LAB — CBC
HCT: 43.1 % (ref 39.0–52.0)
Hemoglobin: 14.6 g/dL (ref 13.0–17.0)
MCH: 29.6 pg (ref 26.0–34.0)
MCHC: 33.9 g/dL (ref 30.0–36.0)
MCV: 87.4 fL (ref 80.0–100.0)
Platelets: 235 10*3/uL (ref 150–400)
RBC: 4.93 MIL/uL (ref 4.22–5.81)
RDW: 13 % (ref 11.5–15.5)
WBC: 9 10*3/uL (ref 4.0–10.5)
nRBC: 0 % (ref 0.0–0.2)

## 2022-02-27 LAB — DIFFERENTIAL
Abs Immature Granulocytes: 0.04 10*3/uL (ref 0.00–0.07)
Basophils Absolute: 0.1 10*3/uL (ref 0.0–0.1)
Basophils Relative: 1 %
Eosinophils Absolute: 0.4 10*3/uL (ref 0.0–0.5)
Eosinophils Relative: 4 %
Immature Granulocytes: 0 %
Lymphocytes Relative: 20 %
Lymphs Abs: 1.8 10*3/uL (ref 0.7–4.0)
Monocytes Absolute: 0.9 10*3/uL (ref 0.1–1.0)
Monocytes Relative: 10 %
Neutro Abs: 5.9 10*3/uL (ref 1.7–7.7)
Neutrophils Relative %: 65 %

## 2022-02-27 LAB — URINALYSIS, ROUTINE W REFLEX MICROSCOPIC
Bacteria, UA: NONE SEEN
Bilirubin Urine: NEGATIVE
Glucose, UA: NEGATIVE mg/dL
Ketones, ur: NEGATIVE mg/dL
Leukocytes,Ua: NEGATIVE
Nitrite: NEGATIVE
Protein, ur: NEGATIVE mg/dL
Specific Gravity, Urine: 1.03 (ref 1.005–1.030)
pH: 7 (ref 5.0–8.0)

## 2022-02-27 LAB — RESP PANEL BY RT-PCR (FLU A&B, COVID) ARPGX2
Influenza A by PCR: NEGATIVE
Influenza B by PCR: NEGATIVE
SARS Coronavirus 2 by RT PCR: NEGATIVE

## 2022-02-27 LAB — RAPID URINE DRUG SCREEN, HOSP PERFORMED
Amphetamines: NOT DETECTED
Barbiturates: NOT DETECTED
Benzodiazepines: NOT DETECTED
Cocaine: NOT DETECTED
Opiates: NOT DETECTED
Tetrahydrocannabinol: NOT DETECTED

## 2022-02-27 LAB — I-STAT VENOUS BLOOD GAS, ED
Acid-Base Excess: 3 mmol/L — ABNORMAL HIGH (ref 0.0–2.0)
Bicarbonate: 26 mmol/L (ref 20.0–28.0)
Calcium, Ion: 1.11 mmol/L — ABNORMAL LOW (ref 1.15–1.40)
HCT: 40 % (ref 39.0–52.0)
Hemoglobin: 13.6 g/dL (ref 13.0–17.0)
O2 Saturation: 99 %
Potassium: 3.6 mmol/L (ref 3.5–5.1)
Sodium: 142 mmol/L (ref 135–145)
TCO2: 27 mmol/L (ref 22–32)
pCO2, Ven: 34.6 mmHg — ABNORMAL LOW (ref 44–60)
pH, Ven: 7.484 — ABNORMAL HIGH (ref 7.25–7.43)
pO2, Ven: 120 mmHg — ABNORMAL HIGH (ref 32–45)

## 2022-02-27 LAB — HEMOGLOBIN A1C
Hgb A1c MFr Bld: 6.2 % — ABNORMAL HIGH (ref 4.8–5.6)
Mean Plasma Glucose: 131.24 mg/dL

## 2022-02-27 LAB — ETHANOL: Alcohol, Ethyl (B): <10 mg/dL (ref <10)

## 2022-02-27 LAB — AMMONIA: Ammonia: 31 umol/L (ref 9–35)

## 2022-02-27 LAB — TROPONIN I (HIGH SENSITIVITY): Troponin I (High Sensitivity): 9 ng/L (ref <18)

## 2022-02-27 LAB — D-DIMER, QUANTITATIVE: D-Dimer, Quant: 0.4 ug/mL-FEU (ref 0.00–0.50)

## 2022-02-27 LAB — CBG MONITORING, ED: Glucose-Capillary: 119 mg/dL — ABNORMAL HIGH (ref 70–99)

## 2022-02-27 LAB — APTT: aPTT: 29 seconds (ref 24–36)

## 2022-02-27 MED ORDER — AMLODIPINE BESYLATE 5 MG PO TABS: 10.0000 mg | ORAL_TABLET | Freq: Every day | ORAL | Status: DC

## 2022-02-27 MED ORDER — GLIPIZIDE 5 MG PO TABS: 5.0000 mg | ORAL_TABLET | Freq: Every day | ORAL | Status: DC

## 2022-02-27 MED ORDER — CARBIDOPA-LEVODOPA 25-100 MG PO TABS
1.0000 | ORAL_TABLET | Freq: Four times a day (QID) | ORAL | Status: DC
  Administered 2022-02-27: 2 via ORAL
  Filled 2022-02-27 (×3): qty 2

## 2022-02-27 MED ORDER — IOHEXOL 350 MG/ML SOLN
60.0000 mL | Freq: Once | INTRAVENOUS | Status: AC | PRN
  Administered 2022-02-27: 60 mL via INTRAVENOUS

## 2022-02-27 MED ORDER — PANTOPRAZOLE SODIUM 40 MG PO TBEC: 40.0000 mg | DELAYED_RELEASE_TABLET | Freq: Every day | ORAL | Status: DC

## 2022-02-27 MED ORDER — LISINOPRIL 20 MG PO TABS: 40.0000 mg | ORAL_TABLET | Freq: Every day | ORAL | Status: DC

## 2022-02-27 MED ORDER — GLIPIZIDE 5 MG PO TABS
7.5000 mg | ORAL_TABLET | Freq: Every day | ORAL | Status: DC
  Filled 2022-02-27: qty 1

## 2022-02-27 MED ORDER — STROKE: EARLY STAGES OF RECOVERY BOOK
Freq: Once | Status: DC
  Filled 2022-02-27: qty 1

## 2022-02-27 MED ORDER — RIVASTIGMINE 9.5 MG/24HR TD PT24
9.5000 mg | MEDICATED_PATCH | Freq: Every day | TRANSDERMAL | Status: DC
  Filled 2022-02-27: qty 1

## 2022-02-27 MED ORDER — SODIUM CHLORIDE 0.9% FLUSH
3.0000 mL | Freq: Once | INTRAVENOUS | Status: AC
  Administered 2022-02-27: 3 mL via INTRAVENOUS

## 2022-02-27 MED ORDER — MIRTAZAPINE 15 MG PO TABS
15.0000 mg | ORAL_TABLET | Freq: Every day | ORAL | Status: DC
  Administered 2022-02-27: 15 mg via ORAL
  Filled 2022-02-27: qty 1

## 2022-02-27 MED ORDER — METFORMIN HCL 500 MG PO TABS: 1000.0000 mg | ORAL_TABLET | Freq: Two times a day (BID) | ORAL | Status: DC

## 2022-02-27 MED ORDER — GLIPIZIDE 5 MG PO TABS: 5.0000 mg | ORAL_TABLET | ORAL | Status: DC

## 2022-02-27 MED ORDER — FOOD THICKENER (SIMPLYTHICK)
1.0000 | ORAL | Status: DC | PRN
  Administered 2022-02-27: 1 via ORAL
  Filled 2022-02-27: qty 1

## 2022-02-27 MED ORDER — ROPINIROLE HCL 1 MG PO TABS
1.0000 mg | ORAL_TABLET | Freq: Three times a day (TID) | ORAL | Status: DC
  Administered 2022-02-27: 1 mg via ORAL
  Filled 2022-02-27 (×2): qty 1

## 2022-02-27 MED ORDER — PAROXETINE HCL 10 MG PO TABS
10.0000 mg | ORAL_TABLET | Freq: Every day | ORAL | Status: DC
  Administered 2022-02-27: 10 mg via ORAL
  Filled 2022-02-27 (×2): qty 1

## 2022-02-27 NOTE — Progress Notes (Signed)
EEG complete - results pending 

## 2022-02-27 NOTE — ED Notes (Signed)
Pt soiled himself in brief. RN unable to collect urine sample. Pt cleaned. Brief changed. Condom cath placed on pt. Pt resting comfortably.

## 2022-02-27 NOTE — Consult Note (Signed)
Neurology Consultation  Reason for Consult: Code stroke Referring Physician: Dr. Armandina Gemma  CC: Episode of dazing off followed by decreased verbalization  History is obtained from: Chart review, patient's wife and daughter via telephone, EMS  HPI: Kirk Rosario is a 81 y.o. male with a medical history significant for CAD, type 2 diabetes mellitus, hyperlipidemia, CVA, Parkinson's disease s/p deep brain stimulator placement in 2004.  Patient is seen outpatient for his Parkinson's disease with associated severe dysphagia, dysarthria, hallucinations, spells of altered awareness, frequent falls, and dementia.  Of note, patient was recently recommended for feeding tube but patient refused.  Patient has also recently had medication changes for his Parkinson's disease about 1 week ago though those documents are unable for review as a changes were through the [place].  Today at around 17:15, the patient's wife states that the patient was shaking all over, could not talk, and stated that he could not breathe before dazing off like "he was in another world".  With further questioning, patient's daughter states that on her arrival, she noted her father's legs shaking "like they would if you cut off his DBS".  Patient's wife did state that he shakes all over with his Parkinson's disease.  Since the patient complained of not being able to breathe, EMS was activated.  Patient's daughter does state that he has had increasing spells spacing out recently.  She states that they are happening with increased frequency over the past week and typically last from 30-60 seconds at a time.  Today's episode reportedly lasted greater than 10 minutes.  Recent medication changes include increase in the patient's rivastigmine as well as dosage replacement of his ropinirole  LKW: 1630 TNK given?: no, patient back to baseline while in CT.  Low suspicion for acute ischemia. IR Thrombectomy? No, vessel imaging reviewed without evidence of  LVO Modified Rankin Scale: 4-Needs assistance to walk and tend to bodily needs Incontinent at night especially and requires help cleaning himself if he has a bowel movement accident, but otherwise feeds himself and during the day is able to indicate when he needs to be taken to the bathroom  ROS: A complete ROS was performed and is negative except as noted in the HPI.   Past Medical History:  Diagnosis Date   Anxiety    CAD (coronary artery disease) 06/25/2011   Chest pain at rest 06/07/2011   CVA (cerebral infarction) 06/24/2011   Diabetes mellitus    DM (diabetes mellitus) ([place]) 06/07/2011   Erosive esophagitis    Headache(784.0)    Hernia, hiatal    Hiatal hernia    Hypercholesteremia    Internal hemorrhoids    Parkinson disease (HCC)    S/P deep brain stimulator placement    Schatzki's ring    Stroke ([place])    Tubular adenoma of colon 12/2008   Past Surgical History:  Procedure Laterality Date   CARDIAC CATHETERIZATION  06/21/2011   DEEP BRAIN STIMULATOR PLACEMENT     EYE SURGERY     HAND SURGERY     LEFT HEART CATHETERIZATION WITH CORONARY ANGIOGRAM N/A 06/21/2011   Procedure: LEFT HEART CATHETERIZATION WITH CORONARY ANGIOGRAM;  Surgeon: Leonie Man, MD;  Location: [facility] CATH LAB;  Service: Cardiovascular;  Laterality: N/A;   UPPER GASTROINTESTINAL ENDOSCOPY     SQUAMOUS MUCOSA WITH DIFFUSE EPITHELIAL HYPERPLASIA, PARAKERATOSIS,   Family History  Problem Relation Age of Onset   Heart failure Mother    Cancer Father    Colon cancer Neg Hx  Pancreatic cancer Neg Hx    Rectal cancer Neg Hx    Stomach cancer Neg Hx    Social History:   reports that he quit smoking about 55 years ago. His smoking use included cigarettes. He has never used smokeless tobacco. He reports that he does not drink alcohol and does not use drugs.  Medications No current facility-administered medications for this encounter.  Current Outpatient Medications:    ACCU-CHEK COMFORT CURVE test strip, ,  Disp: , Rfl:    albuterol (PROVENTIL HFA;VENTOLIN HFA) 108 (90 Base) MCG/ACT inhaler, Inhale 2 puffs into the lungs every 4 (four) hours as needed for wheezing or shortness of breath (cough)., Disp: 1 Inhaler, Rfl: 0   AMBULATORY NON FORMULARY MEDICATION, Budesonide '2mg'$ /49m twice daily suspension (Patient not taking: Reported on 09/14/2015), Disp: 600 mL, Rfl: 5   amLODipine (NORVASC) 5 MG tablet, Take 5 mg by mouth daily., Disp: , Rfl:    aspirin EC 325 MG tablet, Take 325 mg by mouth daily as needed. For pain , Disp: , Rfl:    carbidopa-levodopa (SINEMET) 25-100 MG per tablet, Take 1-2 tablets by mouth See admin instructions. Take 1 tablet 4 times a day then take 2 tablets at bedtime, Disp: , Rfl:    carboxymethylcellulose (REFRESH PLUS) 0.5 % SOLN, Place 1 drop into both eyes 4 (four) times daily., Disp: , Rfl:    cholecalciferol (VITAMIN D) 1000 units tablet, Take 1,000 Units by mouth daily., Disp: , Rfl:    glipiZIDE (GLUCOTROL) 5 MG tablet, Take by mouth 2 (two) times daily before a meal., Disp: , Rfl:    lidocaine (LIDODERM) 5 %, Place 1 patch onto the skin daily. Remove & Discard patch within 12 hours or as directed by MD, Disp: 5 patch, Rfl: 0   lisinopril (PRINIVIL,ZESTRIL) 40 MG tablet, Take 40 mg by mouth daily., Disp: , Rfl:    meclizine (ANTIVERT) 25 MG tablet, Take 1 or 2 po Q 6hrs for dizziness, Disp: 60 tablet, Rfl: 0   metFORMIN (GLUCOPHAGE) 1000 MG tablet, Take 1,000 mg by mouth 2 (two) times daily with a meal., Disp: , Rfl:    omeprazole (PRILOSEC) 20 MG capsule, Take 1 capsule (20 mg total) by mouth 2 (two) times daily., Disp: 180 capsule, Rfl: 0   ondansetron (ZOFRAN) 4 MG tablet, Take 1 tablet (4 mg total) by mouth every 8 (eight) hours as needed for nausea or vomiting (spinning)., Disp: 20 tablet, Rfl: 0   PARoxetine (PAXIL) 20 MG tablet, Take 40 mg by mouth daily. , Disp: , Rfl:    pravastatin (PRAVACHOL) 40 MG tablet, Take 40 mg by mouth every evening.  , Disp: , Rfl:     prazosin (MINIPRESS) 2 MG capsule, Take 6 mg by mouth at bedtime. , Disp: , Rfl:    rOPINIRole (REQUIP) 2 MG tablet, Take 2 mg by mouth 3 (three) times daily., Disp: , Rfl:   Exam: Current vital signs: BP (!) 168/89   Pulse 89   Temp 98.1 F (36.7 C) (Oral)   Resp (!) 21   Wt 101.4 kg   SpO2 98%   BMI 32.08 kg/m  Vital signs in last 24 hours: Temp:  [98.1 F (36.7 C)] 98.1 F (36.7 C) (10/09 1823) Pulse Rate:  [84-89] 89 (10/09 2030) Resp:  [17-23] 21 (10/09 2030) BP: (156-168)/(78-89) 168/89 (10/09 2030) SpO2:  [97 %-99 %] 98 % (10/09 2030) Weight:  [101.4 kg] 101.4 kg (10/09 1700)  GENERAL: Awake, alert, in no acute distress  Psych: Affect appropriate for situation, patient is calm and cooperative with examination Head: Normocephalic and atraumatic, without obvious abnormality EENT: Normal conjunctivae, dry mucous membranes, no OP obstruction LUNGS: Normal respiratory effort. Non-labored breathing on room air CV: Regular rate and rhythm on telemetry ABDOMEN: Soft, non-tender, non-distended Extremities: Warm, well perfused, without obvious deformity  NEURO:  Mental Status: Awake, alert, and oriented to self, age, month. Speech is mildly dysarthric and slowed with delayed responses. Patient follows simple commands, is able to name objects and repeat phrases.  Comprehension is intact without aphasia. Cranial Nerves:  II: PERRL. Visual fields full.  III, IV, VI: EOMI without gaze preference or nystagmus V: Sensation is intact to light touch and symmetrical to face. VII: Face is symmetric resting and with movement VIII: Hearing intact to voice IX, X: Palate elevation is symmetric.  Hypophonic. XI: Head is grossly midline XII: Tongue protrudes midline Motor: Bilateral upper extremities elevate antigravity without vertical drift. Bilateral lower extremities are able to elevate antigravity but drift to bed.  There is no noted asymmetry. Tone is normal. Bulk is normal.   Sensation: Intact to light touch bilaterally in all four extremities. Coordination: No overt dysmetria noted. Gait: Deferred  NIHSS: 1a Level of Conscious.: 0 1b LOC Questions: 0 1c LOC Commands: 0 2 Best Gaze: 0 3 Visual: 0 4 Facial Palsy: 0 5a Motor Arm - left: 0 5b Motor Arm - Right: 0 6a Motor Leg - Left: 2 6b Motor Leg - Right: 2 7 Limb Ataxia: 0 8 Sensory: 0 9 Best Language: 0 10 Dysarthria: 1 11 Extinct. and Inatten.: 0 TOTAL: 5  Labs I have reviewed labs in epic and the results pertinent to this consultation are: CBC    Component Value Date/Time   WBC 9.0 02/27/2022 1745   RBC 4.93 02/27/2022 1745   HGB 14.6 02/27/2022 1745   HGB 14.3 02/27/2022 1745   HCT 43.1 02/27/2022 1745   HCT 42.0 02/27/2022 1745   PLT 235 02/27/2022 1745   MCV 87.4 02/27/2022 1745   MCH 29.6 02/27/2022 1745   MCHC 33.9 02/27/2022 1745   RDW 13.0 02/27/2022 1745   LYMPHSABS 1.8 02/27/2022 1745   MONOABS 0.9 02/27/2022 1745   EOSABS 0.4 02/27/2022 1745   BASOSABS 0.1 02/27/2022 1745   CMP     Component Value Date/Time   NA 141 02/27/2022 1745   NA 143 02/27/2022 1745   K 3.9 02/27/2022 1745   K 4.0 02/27/2022 1745   CL 106 02/27/2022 1745   CL 107 02/27/2022 1745   CO2 25 02/27/2022 1745   GLUCOSE 126 (H) 02/27/2022 1745   GLUCOSE 121 (H) 02/27/2022 1745   BUN 16 02/27/2022 1745   BUN 17 02/27/2022 1745   CREATININE 0.83 02/27/2022 1745   CREATININE 0.70 02/27/2022 1745   CALCIUM 8.8 (L) 02/27/2022 1745   PROT 6.7 02/27/2022 1745   ALBUMIN 3.8 02/27/2022 1745   AST 17 02/27/2022 1745   ALT 8 02/27/2022 1745   ALKPHOS 103 02/27/2022 1745   BILITOT 0.3 02/27/2022 1745   GFRNONAA >60 02/27/2022 1745   GFRAA >60 11/02/2016 1542   Lipid Panel     Component Value Date/Time   CHOL  08/20/2010 0505    129        ATP III CLASSIFICATION:  <200     mg/dL   Desirable  200-239  mg/dL   Borderline High  >=240    mg/dL   High  TRIG 158 (H) 08/20/2010 0505   HDL  31 (L) 08/20/2010 0505   CHOLHDL 4.2 08/20/2010 0505   VLDL 32 08/20/2010 0505   LDLCALC  08/20/2010 0505    66        Total Cholesterol/HDL:CHD Risk Coronary Heart Disease Risk Table                     Men   Women  1/2 Average Risk   3.4   3.3  Average Risk       5.0   4.4  2 X Average Risk   9.6   7.1  3 X Average Risk  23.4   11.0        Use the calculated Patient Ratio above and the CHD Risk Table to determine the patient's CHD Risk.        ATP III CLASSIFICATION (LDL):  <100     mg/dL   Optimal  100-129  mg/dL   Near or Above                    Optimal  130-159  mg/dL   Borderline  160-189  mg/dL   High  >190     mg/dL   Very High   Imaging I have reviewed the images obtained:  CT-scan of the brain 10/9: 1. No acute intracranial process. 2. ASPECTS is 10.  CTA head and neck 02/27/22:  1. No intracranial large vessel occlusion. 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 2. No hemodynamically significant stenosis in the neck.   Assessment: Most likely dementia related fluctuation, with differential including seizures and stroke.  Due to DBS electrodes he cannot have MRI brain  Impression:  Recommendations: -Routine EEG -Repeat head CT at 6 AM for stability given inability to obtain MRI brain -Given patient is at his baseline we will hold off on PT/OT/SLP at this time -Echocardiogram, A1c, lipid panel for stroke risk optimization, cardiac monitoring, every 4 hours neurochecks -Home medication doses need to be confirmed with family, would not make dose changes in his neurological medications at this time given he is back to his baseline currently.  Per Dr. Deboraha Sprang note on 01/11/2022, Exelon patch was increased to 9.5 mg per 24 hours and Requip was reduced to 1 mg 3 times daily -Neurology will follow  Pt seen by NP/Neuro and later by MD. Note/plan to be  edited by MD as needed.  Anibal Henderson, AGAC-NP [facility] Pager: [phone]  Attending Neurologist's note:  I personally saw this patient, gathering history, performing a neurologic examination, reviewing relevant labs, personally reviewing relevant imaging including head CT and CTA, and formulated the assessment and plan, adding the note above for completeness and clarity to accurately reflect my thoughts

## 2022-02-27 NOTE — ED Notes (Signed)
Pt failed swallow screen. MD notified.

## 2022-02-27 NOTE — Code Documentation (Signed)
Stroke Response Nurse Documentation Code Documentation  Kirk Rosario is a 81 y.o. male arriving to [facility]  via [facility] on 02/27/2022 with past medical hx of parkinson disease, hypercholesteremia, anxiety, headache, diabetes, stroke 2013, CAD and has a deep brain stimulator preventing MRI. On No antithrombotic. Code stroke was activated by EMS.   Patient from home where he was LKW at 1630. Wife noted sudden facial droop, aphasia and "eyes glazed over".   Stroke team at the bedside on patient arrival. Labs drawn and patient cleared for CT by Dr. Armandina Gemma. Patient to CT with team. NIHSS 5, see documentation for details and code stroke times. Patient with bilateral leg weakness on exam. The following imaging was completed:  CT Head and CTA. Patient is not a candidate for IV Thrombolytic due to symptoms resolved. Patient  a candidate for IR due to no LVO.   Care Plan: Q2 assessments.   Bedside handoff with ED RN Herbert Spires.    Leverne Humbles Stroke Response RN

## 2022-02-27 NOTE — ED Provider Notes (Signed)
[facility] EMERGENCY DEPARTMENT Provider Note   CSN: [id number] Arrival date & time: 02/27/22  1736  An emergency department physician performed an initial assessment on this suspected stroke patient at 1737.  History  Chief Complaint  Patient presents with   Code Stroke    Kirk Rosario is a 81 y.o. male.  HPI   81 year old male with medical history significant for Parkinson's disease and dementia, HLD, DM 2, CVA, CAD, status post deep brain stimulator placement, who presents to the emergency department as a code stroke.  He has had left-sided deficits, reported facial droop per family and a left gaze preference.  He has some slurred speech at baseline.  The patient had a transient spell of altered awareness which has been going on intermittently for the past few weeks per family.  At around 1715, the patient's wife states that the patient had been shaking all over and could not talk and stated that he could not breathe.  The spells have been lasting 30 to 60 seconds at a time and today's episode lasted around 10 minutes.  He is not back to baseline per family and is still somewhat confused.  Arrival, the patient's airway was cleared and the patient was taken to the CT scanner for code stroke imaging.  Neurology bedside on patient arrival.  Home Medications Prior to Admission medications   Medication Sig Start Date End Date Taking? Authorizing Provider  acetaminophen (TYLENOL) 325 MG tablet Take 650 mg by mouth daily.   Yes [provider]  amLODipine (NORVASC) 10 MG tablet Take 10 mg by mouth daily.   Yes [provider]  aspirin EC 325 MG tablet Take 325 mg by mouth daily as needed. For pain    Yes [provider]  carbidopa-levodopa (SINEMET) 25-100 MG per tablet Take 1-2 tablets by mouth See admin instructions. Take 1 tablet 4 times a day then take 2 tablets at bedtime   Yes [provider]  carboxymethylcellulose (REFRESH  PLUS) 0.5 % SOLN Place 1 drop into both eyes 4 (four) times daily.   Yes [provider]  glipiZIDE (GLUCOTROL) 5 MG tablet Take 5-7.5 mg by mouth See admin instructions. Take 7.5 mg (1 and 1/2 tablet) by mouth in the morning, and 5 mg (1 tablet) at dinner.   Yes [provider]  lisinopril (PRINIVIL,ZESTRIL) 40 MG tablet Take 40 mg by mouth daily.   Yes [provider]  metFORMIN (GLUCOPHAGE) 1000 MG tablet Take 1,000 mg by mouth 2 (two) times daily with a meal.   Yes [provider]  mirtazapine (REMERON) 15 MG tablet Take 15 mg by mouth at bedtime.   Yes [provider]  omeprazole (PRILOSEC) 20 MG capsule Take 1 capsule (20 mg total) by mouth 2 (two) times daily. Patient taking differently: Take 20 mg by mouth daily. 03/07/13 02/27/22 Yes Ladene Artist, MD  PARoxetine (PAXIL) 10 MG tablet Take 10 mg by mouth daily.   Yes [provider]  rivastigmine (EXELON) 9.5 mg/24hr Place 9.5 mg onto the skin daily.   Yes [provider]  rOPINIRole (REQUIP) 2 MG tablet Take 1 mg by mouth 3 (three) times daily.   Yes [provider]  ACCU-CHEK COMFORT CURVE test strip  05/08/11   [provider]      Allergies    Trihexyphenidyl    Review of Systems   Review of Systems  Unable to perform ROS: Mental status change  Physical Exam Updated Vital Signs BP (!) 154/72   Pulse 87   Temp 98.2 F (36.8 C) (Oral)   Resp 18   Wt 101.4 kg   SpO2 97%   BMI 32.08 kg/m  Physical Exam Vitals and nursing note reviewed.  Constitutional:      General: He is not in acute distress. HENT:     Head: Normocephalic and atraumatic.  Eyes:     Conjunctiva/sclera: Conjunctivae normal.     Pupils: Pupils are equal, round, and reactive to light.  Neck:     Comments: No neck rigidity or meningeal findings Cardiovascular:     Rate and Rhythm: Normal rate and regular rhythm.  Pulmonary:     Effort: Pulmonary effort is normal. No  respiratory distress.  Abdominal:     General: There is no distension.     Tenderness: There is no guarding.  Musculoskeletal:        General: No deformity or signs of injury.     Cervical back: Full passive range of motion without pain and neck supple.     Right lower leg: No edema.     Left lower leg: No edema.  Skin:    Findings: No lesion or rash.  Neurological:     Mental Status: He is alert.     Comments: MENTAL STATUS EXAM:    Orientation: Alert and oriented to person, place and time.  Memory: Cooperative, follows commands well.  Language: Speech is slurred at baseline  CRANIAL NERVES:    CN 2 (Optic): Visual fields intact to confrontation.  CN 3,4,6 (EOM): Pupils equal and reactive to light. Full extraocular eye movement without nystagmus.  CN 5 (Trigeminal): Facial sensation is normal, no weakness of masticatory muscles.  CN 7 (Facial): No facial weakness or asymmetry.  CN 8 (Auditory): Auditory acuity grossly normal.  CN 9,10 (Glossophar): The uvula is midline, the palate elevates symmetrically.  CN 11 (spinal access): Normal sternocleidomastoid and trapezius strength.  CN 12 (Hypoglossal): The tongue is midline. No atrophy or fasciculations.Marland Kitchen   MOTOR:  Muscle Strength: 5/5RUE, 5/5LUE, 5/5RLE, 5/5LLE.   COORDINATION:   Intact finger-to-nose.   SENSATION:   Intact to light touch all four extremities.       ED Results / Procedures / Treatments   Labs (all labs ordered are listed, but only abnormal results are displayed) Labs Reviewed  COMPREHENSIVE METABOLIC PANEL - Abnormal; Notable for the following components:      Result Value   Glucose, Bld 126 (*)    Calcium 8.8 (*)    All other components within normal limits  URINALYSIS, ROUTINE W REFLEX MICROSCOPIC - Abnormal; Notable for the following components:   Hgb urine dipstick SMALL (*)    All other components within normal limits  HEMOGLOBIN A1C - Abnormal; Notable for the following components:   Hgb A1c  MFr Bld 6.2 (*)    All other components within normal limits  I-STAT CHEM 8, ED - Abnormal; Notable for the following components:   Glucose, Bld 121 (*)    Calcium, Ion 1.09 (*)    All other components within normal limits  CBG MONITORING, ED - Abnormal; Notable for the following components:   Glucose-Capillary 119 (*)    All other components within normal limits  I-STAT VENOUS BLOOD GAS, ED - Abnormal; Notable for the following components:   pH, Ven 7.484 (*)    pCO2, Ven 34.6 (*)    pO2, Ven 120 (*)    Acid-Base Excess  3.0 (*)    Calcium, Ion 1.11 (*)    All other components within normal limits  RESP PANEL BY RT-PCR (FLU A&B, COVID) ARPGX2  PROTIME-INR  APTT  CBC  DIFFERENTIAL  ETHANOL  AMMONIA  RAPID URINE DRUG SCREEN, HOSP PERFORMED  D-DIMER, QUANTITATIVE  LIPID PANEL  BLOOD GAS, VENOUS  TROPONIN I (HIGH SENSITIVITY)    EKG EKG Interpretation  Date/Time:  Monday February 27 2022 18:14:13 EDT Ventricular Rate:  89 PR Interval:  242 QRS Duration: 94 QT Interval:  358 QTC Calculation: 436 R Axis:   48 Text Interpretation: Sinus rhythm Prolonged PR interval Abnormal R-wave progression, early transition Nonspecific repol abnormality, diffuse leads Confirmed by Regan Lemming ([id number]) on 02/27/2022 6:31:19 PM  Radiology DG Chest Portable 1 View  Result Date: 02/27/2022 CLINICAL DATA:  Altered mental status. Code stroke. Seizure-like activity. EXAM: PORTABLE CHEST 1 VIEW COMPARISON:  05/24/2018 FINDINGS: Generator pack over the right upper chest with lead tips extending into the neck. Shallow inspiration. Heart size and pulmonary vascularity are normal for technique. Probable linear atelectasis in the lung bases. No focal consolidation or edema. No pleural effusions. No pneumothorax. Mediastinal contours appear intact. IMPRESSION: Shallow inspiration with mild linear atelectasis in the lung bases. No focal consolidation. Electronically Signed   By: Lucienne Capers M.D.   On:  02/27/2022 20:01   CT ANGIO HEAD NECK W WO CM (CODE STROKE)  Result Date: 02/27/2022 CLINICAL DATA:  Stroke suspected EXAM: CT ANGIOGRAPHY HEAD AND NECK TECHNIQUE: Multidetector CT imaging of the head and neck was performed using the standard protocol during bolus administration of intravenous contrast. Multiplanar CT image reconstructions and MIPs were obtained to evaluate the vascular anatomy. Carotid stenosis measurements (when applicable) are obtained utilizing NASCET criteria, using the distal internal carotid diameter as the denominator. RADIATION DOSE REDUCTION: This exam was performed according to the departmental dose-optimization program which includes automated exposure control, adjustment of the mA and/or kV according to patient size and/or use of iterative reconstruction technique. CONTRAST:  33m OMNIPAQUE IOHEXOL 350 MG/ML SOLN COMPARISON:  No prior CTA, correlation is made with CT head 02/27/2022 FINDINGS: CT HEAD FINDINGS For noncontrast findings, please see same day CT head. CTA NECK FINDINGS Aortic arch: Standard branching. Imaged portion shows no evidence of aneurysm or dissection. No significant stenosis of the major arch vessel origins. Right carotid system: No evidence of dissection, occlusion, or hemodynamically significant stenosis (greater than 50%). Left carotid system: No evidence of dissection, occlusion, or hemodynamically significant stenosis (greater than 50%). Vertebral arteries: No evidence of dissection, occlusion, or hemodynamically significant stenosis (greater than 50%). Skeleton: Edentulous. No acute osseous abnormality. Degenerative changes in the cervical spine. Bulky anterior osteophytes C4-C6. Other neck: Deep brain stimulator leads are seen in the soft tissues of the right chest and neck. Upper chest: No focal pulmonary opacity or pleural effusion. Review of the MIP images confirms the above findings CTA HEAD FINDINGS Anterior circulation: Both internal carotid  arteries are patent to the termini, with moderate to severe stenosis in the left supraclinoid ICA, moderate stenosis in the right supraclinoid ICA, and mild stenosis in the right cavernous ICA. A1 segments patent. Normal anterior communicating artery. Anterior cerebral arteries are patent to their distal aspects. No M1 stenosis or occlusion. MCA branches perfused and symmetric. Posterior circulation: Vertebral arteries patent to the vertebrobasilar junction without stenosis. Posterior inferior cerebellar arteries patent proximally. Basilar patent to its distal aspect. Superior cerebellar arteries patent proximally. Patent P1 segments. PCAs perfused to their distal aspects  without stenosis. The bilateral posterior communicating arteries are not visualized. Venous sinuses: As permitted by contrast timing, patent. Anatomic variants: None significant. Review of the MIP images confirms the above findings IMPRESSION: 1. No intracranial large vessel occlusion. 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 2. No hemodynamically significant stenosis in the neck. Electronically Signed   By: Merilyn Baba M.D.   On: 02/27/2022 18:54   CT HEAD CODE STROKE WO CONTRAST  Result Date: 02/27/2022 CLINICAL DATA:  Code stroke. EXAM: CT HEAD WITHOUT CONTRAST TECHNIQUE: Contiguous axial images were obtained from the base of the skull through the vertex without intravenous contrast. RADIATION DOSE REDUCTION: This exam was performed according to the departmental dose-optimization program which includes automated exposure control, adjustment of the mA and/or kV according to patient size and/or use of iterative reconstruction technique. COMPARISON:  09/14/2015 FINDINGS: Brain: Redemonstrated deep brain stimulator leads. No evidence of acute infarction, hemorrhage, cerebral edema, mass, mass effect, or midline shift. No  hydrocephalus or extra-axial collection. Vascular: No hyperdense vessel. Atherosclerotic calcifications in the intracranial carotid and vertebral arteries. Skull: Negative for fracture or focal lesion. Sinuses/Orbits: Mucosal thickening in the right maxillary sinus, bilateral sphenoid sinuses, and ethmoid air cells. Status post bilateral lens replacements. Other: The mastoid air cells are well aerated. ASPECTS [facility] Stroke Program Early CT Score) - Ganglionic level infarction (caudate, lentiform nuclei, internal capsule, insula, M1-M3 cortex): 7 - Supraganglionic infarction (M4-M6 cortex): 3 Total score (0-10 with 10 being normal): 10 IMPRESSION: 1. No acute intracranial process. 2. ASPECTS is 10. Code stroke imaging results were communicated on 02/27/2022 at 6:28 pm to provider BHAGAT via secure text paging. Electronically Signed   By: Merilyn Baba M.D.   On: 02/27/2022 18:29    Procedures Procedures    Medications Ordered in ED Medications   stroke: early stages of recovery book (has no administration in time range)  food thickener (SIMPLYTHICK (NECTAR/LEVEL 2/MILDLY THICK)) 1 packet (1 packet Oral Given 02/27/22 2247)  carbidopa-levodopa (SINEMET IR) 25-100 MG per tablet immediate release 1-2 tablet (2 tablets Oral Given 02/27/22 2249)  lisinopril (ZESTRIL) tablet 40 mg (has no administration in time range)  mirtazapine (REMERON) tablet 15 mg (15 mg Oral Given 02/27/22 2249)  pantoprazole (PROTONIX) EC tablet 40 mg (has no administration in time range)  rivastigmine (EXELON) 9.5 mg/24hr 9.5 mg (has no administration in time range)  rOPINIRole (REQUIP) tablet 1 mg (1 mg Oral Given 02/27/22 2249)  amLODipine (NORVASC) tablet 10 mg (has no administration in time range)  PARoxetine (PAXIL) tablet 10 mg (10 mg Oral Given 02/27/22 2249)  glipiZIDE (GLUCOTROL) tablet 7.5 mg (has no administration in time range)    And  glipiZIDE (GLUCOTROL) tablet 5 mg (has no administration in time range)  metFORMIN  (GLUCOPHAGE) tablet 1,000 mg (has no administration in time range)  sodium chloride flush (NS) 0.9 % injection 3 mL (3 mLs Intravenous Given 02/27/22 1824)  iohexol (OMNIPAQUE) 350 MG/ML injection 60 mL (60 mLs Intravenous Contrast Given 02/27/22 1808)    ED Course/ Medical Decision Making/ A&P                           Medical Decision Making Amount and/or Complexity of Data Reviewed Labs: ordered. Radiology: ordered.  Risk Prescription drug management. Decision regarding hospitalization.   81 year old male with medical history significant for Parkinson's disease and dementia, HLD,  DM 2, CVA, CAD, status post deep brain stimulator placement, who presents to the emergency department as a code stroke.  He has had left-sided deficits, reported facial droop per family and a left gaze preference.  He has some slurred speech at baseline.  The patient had a transient spell of altered awareness which has been going on intermittently for the past few weeks per family.  At around 1715, the patient's wife states that the patient had been shaking all over and could not talk and stated that he could not breathe.  The spells have been lasting 30 to 60 seconds at a time and today's episode lasted around 10 minutes.  He is not back to baseline per family and is still somewhat confused.  Arrival, the patient's airway was cleared and the patient was taken to the CT scanner for code stroke imaging.  Neurology bedside on patient arrival.  Not a candidate for thrombolytics.  On arrival, patient was vitally stable, afebrile, not tachycardic or tachypneic, BP 164/82, saturating 99% on room air.  Physical exam generally unremarkable as per above.  Patient presenting for possible strokelike symptoms versus seizure.  Neurology evaluated patient and lower concern for stroke at this time.  CT angio code stroke head and neck performed and revealed no acute LVO or intracranial abnormality.  CTA Neck: IMPRESSION:  1. No  intracranial large vessel occlusion. Moderate to severe  stenosis in the left supraclinoid ICA, moderate stenosis in the  right supraclinoid ICA, and mild stenosis in the right cavernous  ICA.  2. No hemodynamically significant stenosis in the neck.   CT head Code Stroke: IMPRESSION:  1. No acute intracranial process.  2. ASPECTS is 10.    Code stroke imaging results were communicated on 02/27/2022 at 6:28  pm to provider BHAGAT via secure text paging.    CXR: IMPRESSION:  Shallow inspiration with mild linear atelectasis in the lung bases.  No focal consolidation.    Broad-based laboratory evaluation performed to include CBG 119, i-STAT Chem-8 unremarkable, ethanol level normal, CBC without leukocytosis or anemia, CMP unremarkable, urinalysis and UDS unremarkable, COVID-19 influenza PCR testing negative, troponin normal, D-dimer normal, VBG with a respiratory alkalosis, otherwise unremarkable.  Work-up largely reassuring, however, unable to perform MRI imaging.  Neurology assessment felt "most likely dementia related fluctuation, with differential including seizures and stroke.  Due to DBS electrode she cannot have MRI brain.  Recommendations included routine EEG, repeat head CT at 6 AM for stability, echocardiogram, cardiac monitoring, every 4 hours neurochecks.  Neurology will follow inpatient.  Medicine consulted for admission.   Final Clinical Impression(s) / ED Diagnoses Final diagnoses:  Transient alteration of awareness    Rx / DC Orders ED Discharge Orders     None         Regan Lemming, MD 02/27/22 2335

## 2022-02-27 NOTE — ED Notes (Signed)
RN reached out to phlebotomy for ammonia. Phlebotomy states they will come stick the pt for ammonia level.

## 2022-02-27 NOTE — ED Notes (Signed)
Pt overdue for home medications and family requesting home medications be given to pt. Pt takes medications at home with thickened liquids. RN spoke to MD about pt failing swallow screen d/t pt requiring thickened liquids. MD stated it was ok for RN to give pt home medications that provider will order with thickened liquids.

## 2022-02-27 NOTE — ED Triage Notes (Signed)
Pt BIB EMS due to a code stroke. LSN was 1630. Pt had left sided deficits, facial droop, and left gaze. Pt has slurred speech at baseline. Seizure like activity is questionable per ems. Pt has hx of parkinsons. VSS.

## 2022-02-27 NOTE — ED Notes (Signed)
Pt able to swallow pills appropriately with thickened liquids.

## 2022-02-27 NOTE — ED Notes (Signed)
Attempted to collect urine sample. Pt states he is unable to urinate at this time. Will attempt for urine sample again. Pt given urinal. EEG at bedside.

## 2022-02-27 NOTE — ED Notes (Signed)
Pt urinated on himself. RN changed diaper. New diaper applied.  Family at bedsdie. Wife at bedside states pt is "out of it". RN delayed swallow screen due to pts state.

## 2022-02-28 ENCOUNTER — Observation Stay (HOSPITAL_COMMUNITY): Payer: Medicare Other

## 2022-02-28 ENCOUNTER — Observation Stay (HOSPITAL_BASED_OUTPATIENT_CLINIC_OR_DEPARTMENT_OTHER): Payer: Medicare Other

## 2022-02-28 DIAGNOSIS — I2510 Atherosclerotic heart disease of native coronary artery without angina pectoris: Secondary | ICD-10-CM | POA: Diagnosis not present

## 2022-02-28 DIAGNOSIS — F419 Anxiety disorder, unspecified: Secondary | ICD-10-CM | POA: Diagnosis not present

## 2022-02-28 DIAGNOSIS — Z8673 Personal history of transient ischemic attack (TIA), and cerebral infarction without residual deficits: Secondary | ICD-10-CM

## 2022-02-28 DIAGNOSIS — G459 Transient cerebral ischemic attack, unspecified: Secondary | ICD-10-CM | POA: Diagnosis not present

## 2022-02-28 DIAGNOSIS — R4182 Altered mental status, unspecified: Secondary | ICD-10-CM | POA: Diagnosis not present

## 2022-02-28 DIAGNOSIS — F0280 Dementia in other diseases classified elsewhere without behavioral disturbance: Secondary | ICD-10-CM

## 2022-02-28 DIAGNOSIS — R29818 Other symptoms and signs involving the nervous system: Secondary | ICD-10-CM | POA: Diagnosis present

## 2022-02-28 DIAGNOSIS — E785 Hyperlipidemia, unspecified: Secondary | ICD-10-CM

## 2022-02-28 DIAGNOSIS — E119 Type 2 diabetes mellitus without complications: Secondary | ICD-10-CM

## 2022-02-28 DIAGNOSIS — I10 Essential (primary) hypertension: Secondary | ICD-10-CM

## 2022-02-28 DIAGNOSIS — K219 Gastro-esophageal reflux disease without esophagitis: Secondary | ICD-10-CM

## 2022-02-28 DIAGNOSIS — F4310 Post-traumatic stress disorder, unspecified: Secondary | ICD-10-CM

## 2022-02-28 DIAGNOSIS — R1310 Dysphagia, unspecified: Secondary | ICD-10-CM

## 2022-02-28 DIAGNOSIS — G20A1 Parkinson's disease without dyskinesia, without mention of fluctuations: Secondary | ICD-10-CM

## 2022-02-28 LAB — COMPREHENSIVE METABOLIC PANEL
ALT: 6 U/L (ref 0–44)
AST: 13 U/L — ABNORMAL LOW (ref 15–41)
Albumin: 3.2 g/dL — ABNORMAL LOW (ref 3.5–5.0)
Alkaline Phosphatase: 83 U/L (ref 38–126)
Anion gap: 10 (ref 5–15)
BUN: 15 mg/dL (ref 8–23)
CO2: 22 mmol/L (ref 22–32)
Calcium: 8.2 mg/dL — ABNORMAL LOW (ref 8.9–10.3)
Chloride: 107 mmol/L (ref 98–111)
Creatinine, Ser: 0.77 mg/dL (ref 0.61–1.24)
GFR, Estimated: >60 mL/min (ref >60)
Glucose, Bld: 121 mg/dL — ABNORMAL HIGH (ref 70–99)
Potassium: 3.2 mmol/L — ABNORMAL LOW (ref 3.5–5.1)
Sodium: 139 mmol/L (ref 135–145)
Total Bilirubin: 0.5 mg/dL (ref 0.3–1.2)
Total Protein: 5.7 g/dL — ABNORMAL LOW (ref 6.5–8.1)

## 2022-02-28 LAB — CBC
HCT: 38.7 % — ABNORMAL LOW (ref 39.0–52.0)
Hemoglobin: 12.9 g/dL — ABNORMAL LOW (ref 13.0–17.0)
MCH: 29.7 pg (ref 26.0–34.0)
MCHC: 33.3 g/dL (ref 30.0–36.0)
MCV: 89 fL (ref 80.0–100.0)
Platelets: 235 10*3/uL (ref 150–400)
RBC: 4.35 MIL/uL (ref 4.22–5.81)
RDW: 12.9 % (ref 11.5–15.5)
WBC: 7.4 10*3/uL (ref 4.0–10.5)
nRBC: 0 % (ref 0.0–0.2)

## 2022-02-28 LAB — ECHOCARDIOGRAM COMPLETE
Area-P 1/2: 2.74 cm2
S' Lateral: 3.6 cm
Weight: 3576.74 oz

## 2022-02-28 LAB — LIPID PANEL
Cholesterol: 99 mg/dL (ref 0–200)
HDL: 35 mg/dL — ABNORMAL LOW (ref >40)
LDL Cholesterol: 50 mg/dL (ref 0–99)
Total CHOL/HDL Ratio: 2.8 RATIO
Triglycerides: 69 mg/dL (ref <150)
VLDL: 14 mg/dL (ref 0–40)

## 2022-02-28 LAB — CBG MONITORING, ED: Glucose-Capillary: 119 mg/dL — ABNORMAL HIGH (ref 70–99)

## 2022-02-28 MED ORDER — GLIPIZIDE 5 MG PO TABS
5.0000 mg | ORAL_TABLET | Freq: Every day | ORAL | Status: DC
  Filled 2022-02-28: qty 1

## 2022-02-28 MED ORDER — PERFLUTREN LIPID MICROSPHERE
1.0000 mL | INTRAVENOUS | Status: AC | PRN
  Administered 2022-02-28: 4 mL via INTRAVENOUS

## 2022-02-28 MED ORDER — TRAZODONE HCL 50 MG PO TABS: 50.0000 mg | ORAL_TABLET | Freq: Every evening | ORAL | Status: DC | PRN

## 2022-02-28 MED ORDER — SODIUM CHLORIDE 0.9% FLUSH
3.0000 mL | Freq: Two times a day (BID) | INTRAVENOUS | Status: DC
  Administered 2022-02-28: 3 mL via INTRAVENOUS

## 2022-02-28 MED ORDER — ENOXAPARIN SODIUM 40 MG/0.4ML IJ SOSY: 40.0000 mg | PREFILLED_SYRINGE | INTRAMUSCULAR | Status: DC

## 2022-02-28 MED ORDER — CALCIUM GLUCONATE-NACL 2-0.675 GM/100ML-% IV SOLN
2.0000 g | Freq: Once | INTRAVENOUS | Status: AC
  Administered 2022-02-28: 2000 mg via INTRAVENOUS
  Filled 2022-02-28 (×2): qty 100

## 2022-02-28 MED ORDER — POLYETHYLENE GLYCOL 3350 17 G PO PACK: 17.0000 g | PACK | Freq: Every day | ORAL | Status: DC | PRN

## 2022-02-28 MED ORDER — INSULIN ASPART 100 UNIT/ML IJ SOLN: 0.0000 [IU] | Freq: Three times a day (TID) | INTRAMUSCULAR | Status: DC

## 2022-02-28 MED ORDER — POTASSIUM CHLORIDE 10 MEQ/100ML IV SOLN
10.0000 meq | INTRAVENOUS | Status: AC
  Administered 2022-02-28 (×3): 10 meq via INTRAVENOUS
  Filled 2022-02-28 (×4): qty 100

## 2022-02-28 MED ORDER — METOPROLOL TARTRATE 5 MG/5ML IV SOLN: 5.0000 mg | INTRAVENOUS | Status: DC | PRN

## 2022-02-28 MED ORDER — GLIPIZIDE 5 MG PO TABS
7.5000 mg | ORAL_TABLET | Freq: Every day | ORAL | Status: DC
  Filled 2022-02-28 (×2): qty 1.5

## 2022-02-28 MED ORDER — SODIUM CHLORIDE 0.9 % IV SOLN: INTRAVENOUS | Status: DC

## 2022-02-28 MED ORDER — GUAIFENESIN 100 MG/5ML PO LIQD: 5.0000 mL | ORAL | Status: DC | PRN

## 2022-02-28 MED ORDER — ONDANSETRON HCL 4 MG/2ML IJ SOLN: 4.0000 mg | Freq: Four times a day (QID) | INTRAMUSCULAR | Status: DC | PRN

## 2022-02-28 MED ORDER — SENNOSIDES-DOCUSATE SODIUM 8.6-50 MG PO TABS: 1.0000 | ORAL_TABLET | Freq: Every evening | ORAL | Status: DC | PRN

## 2022-02-28 MED ORDER — ACETAMINOPHEN 325 MG PO TABS: 650.0000 mg | ORAL_TABLET | Freq: Four times a day (QID) | ORAL | Status: DC | PRN

## 2022-02-28 MED ORDER — ACETAMINOPHEN 650 MG RE SUPP: 650.0000 mg | Freq: Four times a day (QID) | RECTAL | Status: DC | PRN

## 2022-02-28 MED ORDER — IPRATROPIUM-ALBUTEROL 0.5-2.5 (3) MG/3ML IN SOLN: 3.0000 mL | RESPIRATORY_TRACT | Status: DC | PRN

## 2022-02-28 NOTE — Progress Notes (Signed)
  Echocardiogram 2D Echocardiogram has been performed.  Kirk Rosario 02/28/2022, 10:53 AM

## 2022-02-28 NOTE — H&P (Addendum)
History and Physical   Kirk Rosario GHW:[id number] DOB: Nov 16, 1940 DOA: 02/27/2022  PCP: Eldridge Abrahams, MD   Patient coming from: Home  Chief Complaint: Code stroke  HPI: Kirk Rosario is a 81 y.o. male with medical history significant of hyperlipidemia, CAD, CVA, dysphagia, Parkinson's, dementia, diabetes, GERD, anxiety, PTSD, hypertension, OSA presenting as a code stroke.  History obtained with assistance of chart review and family.  Patient reportedly had left-sided deficits with left-sided facial droop and left gaze preference.  Patient with history of CVA and has chronic slurred speech and dysphagia.  Patient also reporting transient episodes of confusion and shaking.  These are described as shaking all over and unable to talk lasting 30 to 60 seconds.  Have occurred intermittently for the past 2 or 3 weeks.  Had an episode today lasting 10 minutes and remained confused after that episode.  Unable to obtain full review of systems due to patient's dementia.  ED Course: Vital signs in the ED significant for blood pressure in the 789F to 810 systolic.  Lab work-up included CMP with glucose 126 and calcium 8.8.  CBC within normal limits.  PT, PTT, INR all within normal limits.  Troponin normal.  D-dimer normal.  Rester panel flu and COVID-negative.  Ethanol level negative.  Urinalysis with small hemoglobin only.  UDS negative.  Ammonia level normal.  Lipid panel pending.  A1c 6.2.  VBG with pH of 7.48 and PCO2 of 34.6.  Imaging work-up included chest x-ray which showed shallow inspiration with linear atelectasis at bases.  CT head showed no acute abnormality.  CTA head and neck showed moderate to severe stenosis at the left supraclinoid ICA and moderate stenosis of the right supraclinoid ICA.  Patient's home medications were reordered in the ED.  Neurology was consulted and based on their work-up suspect patient likely is having fluctuation in dementia versus stroke/TIA versus  seizure.  Review of Systems: Unable to get full review of systems due to patient's dementia.  Past Medical History:  Diagnosis Date   Anxiety    CAD (coronary artery disease) 06/25/2011   Chest pain at rest 06/07/2011   CVA (cerebral infarction) 06/24/2011   Diabetes mellitus    DM (diabetes mellitus) ([place]) 06/07/2011   Erosive esophagitis    Headache(784.0)    Hernia, hiatal    Hiatal hernia    Hypercholesteremia    Internal hemorrhoids    Parkinson disease (HCC)    S/P deep brain stimulator placement    Schatzki's ring    Stroke ([place])    Tubular adenoma of colon 12/2008    Past Surgical History:  Procedure Laterality Date   CARDIAC CATHETERIZATION  06/21/2011   DEEP BRAIN STIMULATOR PLACEMENT     EYE SURGERY     HAND SURGERY     LEFT HEART CATHETERIZATION WITH CORONARY ANGIOGRAM N/A 06/21/2011   Procedure: LEFT HEART CATHETERIZATION WITH CORONARY ANGIOGRAM;  Surgeon: Leonie Man, MD;  Location: [facility] CATH LAB;  Service: Cardiovascular;  Laterality: N/A;   UPPER GASTROINTESTINAL ENDOSCOPY     SQUAMOUS MUCOSA WITH DIFFUSE EPITHELIAL HYPERPLASIA, PARAKERATOSIS,    Social History  reports that he quit smoking about 55 years ago. His smoking use included cigarettes. He has never used smokeless tobacco. He reports that he does not drink alcohol and does not use drugs.  Allergies  Allergen Reactions   Trihexyphenidyl     Other reaction(s): Psychotic disorder    Family History  Problem Relation Age of Onset   Heart  failure Mother    Cancer Father    Colon cancer Neg Hx    Pancreatic cancer Neg Hx    Rectal cancer Neg Hx    Stomach cancer Neg Hx   Reviewed on admission  Prior to Admission medications   Medication Sig Start Date End Date Taking? Authorizing Provider  acetaminophen (TYLENOL) 325 MG tablet Take 650 mg by mouth daily.   Yes [provider]  amLODipine (NORVASC) 10 MG tablet Take 10 mg by mouth daily.   Yes [provider]  aspirin EC 325 MG  tablet Take 325 mg by mouth daily as needed. For pain    Yes [provider]  carbidopa-levodopa (SINEMET) 25-100 MG per tablet Take 1-2 tablets by mouth See admin instructions. Take 1 tablet 4 times a day then take 2 tablets at bedtime   Yes [provider]  carboxymethylcellulose (REFRESH PLUS) 0.5 % SOLN Place 1 drop into both eyes 4 (four) times daily.   Yes [provider]  glipiZIDE (GLUCOTROL) 5 MG tablet Take 5-7.5 mg by mouth See admin instructions. Take 7.5 mg (1 and 1/2 tablet) by mouth in the morning, and 5 mg (1 tablet) at dinner.   Yes [provider]  lisinopril (PRINIVIL,ZESTRIL) 40 MG tablet Take 40 mg by mouth daily.   Yes [provider]  metFORMIN (GLUCOPHAGE) 1000 MG tablet Take 1,000 mg by mouth 2 (two) times daily with a meal.   Yes [provider]  mirtazapine (REMERON) 15 MG tablet Take 15 mg by mouth at bedtime.   Yes [provider]  omeprazole (PRILOSEC) 20 MG capsule Take 1 capsule (20 mg total) by mouth 2 (two) times daily. Patient taking differently: Take 20 mg by mouth daily. 03/07/13 02/27/22 Yes Ladene Artist, MD  PARoxetine (PAXIL) 10 MG tablet Take 10 mg by mouth daily.   Yes [provider]  rivastigmine (EXELON) 9.5 mg/24hr Place 9.5 mg onto the skin daily.   Yes [provider]  rOPINIRole (REQUIP) 2 MG tablet Take 1 mg by mouth 3 (three) times daily.   Yes [provider]  ACCU-CHEK COMFORT CURVE test strip  05/08/11   [provider]    Physical Exam: Vitals:   02/27/22 2215 02/27/22 2245 02/27/22 2300 02/27/22 2330  BP: (!) 160/79 (!) 160/77 (!) 154/72 (!) 153/68  Pulse: 90 87 87 81  Resp: '20 20 18 18  '$ Temp:      TempSrc:      SpO2: 97% 96% 97% 97%  Weight:        Physical Exam Constitutional:      General: He is not in acute distress.    Appearance: Normal appearance.  HENT:     Head: Normocephalic and atraumatic.     Mouth/Throat:      Mouth: Mucous membranes are moist.     Pharynx: Oropharynx is clear.  Eyes:     Extraocular Movements: Extraocular movements intact.     Pupils: Pupils are equal, round, and reactive to light.  Cardiovascular:     Rate and Rhythm: Normal rate and regular rhythm.     Pulses: Normal pulses.     Heart sounds: Normal heart sounds.  Pulmonary:     Effort: Pulmonary effort is normal. No respiratory distress.     Breath sounds: Normal breath sounds.  Abdominal:     General: Bowel sounds are normal. There is no distension.     Palpations: Abdomen is soft.  Tenderness: There is no abdominal tenderness.  Musculoskeletal:        General: No swelling or deformity.  Skin:    General: Skin is warm and dry.  Neurological:     General: No focal deficit present.     Mental Status: Mental status is at baseline.     Comments: Patient drowsy and intermittently sleeping when seen.  No focal neurologic deficits appreciated.  Slurred speech noted.    Labs on Admission: I have personally reviewed following labs and imaging studies  CBC: Recent Labs  Lab 02/27/22 1745 02/27/22 2127  WBC 9.0  --   NEUTROABS 5.9  --   HGB 14.6  14.3 13.6  HCT 43.1  42.0 40.0  MCV 87.4  --   PLT 235  --     Basic Metabolic Panel: Recent Labs  Lab 02/27/22 1745 02/27/22 2127  NA 141  143 142  K 3.9  4.0 3.6  CL 106  107  --   CO2 25  --   GLUCOSE 126*  121*  --   BUN 16  17  --   CREATININE 0.83  0.70  --   CALCIUM 8.8*  --     GFR: CrCl cannot be calculated (Unknown ideal weight.).  Liver Function Tests: Recent Labs  Lab 02/27/22 1745  AST 17  ALT 8  ALKPHOS 103  BILITOT 0.3  PROT 6.7  ALBUMIN 3.8    Urine analysis:    Component Value Date/Time   COLORURINE YELLOW 02/27/2022 1923   APPEARANCEUR CLEAR 02/27/2022 1923   LABSPEC 1.030 02/27/2022 1923   PHURINE 7.0 02/27/2022 1923   GLUCOSEU NEGATIVE 02/27/2022 1923   HGBUR SMALL (A) 02/27/2022 1923   BILIRUBINUR NEGATIVE  02/27/2022 [place] NEGATIVE 02/27/2022 1923   PROTEINUR NEGATIVE 02/27/2022 1923   UROBILINOGEN 1.0 06/21/2011 2308   NITRITE NEGATIVE 02/27/2022 1923   LEUKOCYTESUR NEGATIVE 02/27/2022 1923    Radiological Exams on Admission: DG Chest Portable 1 View  Result Date: 02/27/2022 CLINICAL DATA:  Altered mental status. Code stroke. Seizure-like activity. EXAM: PORTABLE CHEST 1 VIEW COMPARISON:  05/24/2018 FINDINGS: Generator pack over the right upper chest with lead tips extending into the neck. Shallow inspiration. Heart size and pulmonary vascularity are normal for technique. Probable linear atelectasis in the lung bases. No focal consolidation or edema. No pleural effusions. No pneumothorax. Mediastinal contours appear intact. IMPRESSION: Shallow inspiration with mild linear atelectasis in the lung bases. No focal consolidation. Electronically Signed   By: Lucienne Capers M.D.   On: 02/27/2022 20:01   CT ANGIO HEAD NECK W WO CM (CODE STROKE)  Result Date: 02/27/2022 CLINICAL DATA:  Stroke suspected EXAM: CT ANGIOGRAPHY HEAD AND NECK TECHNIQUE: Multidetector CT imaging of the head and neck was performed using the standard protocol during bolus administration of intravenous contrast. Multiplanar CT image reconstructions and MIPs were obtained to evaluate the vascular anatomy. Carotid stenosis measurements (when applicable) are obtained utilizing NASCET criteria, using the distal internal carotid diameter as the denominator. RADIATION DOSE REDUCTION: This exam was performed according to the departmental dose-optimization program which includes automated exposure control, adjustment of the mA and/or kV according to patient size and/or use of iterative reconstruction technique. CONTRAST:  48m OMNIPAQUE IOHEXOL 350 MG/ML SOLN COMPARISON:  No prior CTA, correlation is made with CT head 02/27/2022 FINDINGS: CT HEAD FINDINGS For noncontrast findings, please see same day CT head. CTA NECK FINDINGS  Aortic arch: Standard branching. Imaged portion shows no evidence of aneurysm  or dissection. No significant stenosis of the major arch vessel origins. Right carotid system: No evidence of dissection, occlusion, or hemodynamically significant stenosis (greater than 50%). Left carotid system: No evidence of dissection, occlusion, or hemodynamically significant stenosis (greater than 50%). Vertebral arteries: No evidence of dissection, occlusion, or hemodynamically significant stenosis (greater than 50%). Skeleton: Edentulous. No acute osseous abnormality. Degenerative changes in the cervical spine. Bulky anterior osteophytes C4-C6. Other neck: Deep brain stimulator leads are seen in the soft tissues of the right chest and neck. Upper chest: No focal pulmonary opacity or pleural effusion. Review of the MIP images confirms the above findings CTA HEAD FINDINGS Anterior circulation: Both internal carotid arteries are patent to the termini, with moderate to severe stenosis in the left supraclinoid ICA, moderate stenosis in the right supraclinoid ICA, and mild stenosis in the right cavernous ICA. A1 segments patent. Normal anterior communicating artery. Anterior cerebral arteries are patent to their distal aspects. No M1 stenosis or occlusion. MCA branches perfused and symmetric. Posterior circulation: Vertebral arteries patent to the vertebrobasilar junction without stenosis. Posterior inferior cerebellar arteries patent proximally. Basilar patent to its distal aspect. Superior cerebellar arteries patent proximally. Patent P1 segments. PCAs perfused to their distal aspects without stenosis. The bilateral posterior communicating arteries are not visualized. Venous sinuses: As permitted by contrast timing, patent. Anatomic variants: None significant. Review of the MIP images confirms the above findings IMPRESSION: 1. No intracranial large vessel occlusion. 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 2. No hemodynamically significant stenosis in the neck. Electronically Signed   By: Merilyn Baba M.D.   On: 02/27/2022 18:54   CT HEAD CODE STROKE WO CONTRAST  Result Date: 02/27/2022 CLINICAL DATA:  Code stroke. EXAM: CT HEAD WITHOUT CONTRAST TECHNIQUE: Contiguous axial images were obtained from the base of the skull through the vertex without intravenous contrast. RADIATION DOSE REDUCTION: This exam was performed according to the departmental dose-optimization program which includes automated exposure control, adjustment of the mA and/or kV according to patient size and/or use of iterative reconstruction technique. COMPARISON:  09/14/2015 FINDINGS: Brain: Redemonstrated deep brain stimulator leads. No evidence of acute infarction, hemorrhage, cerebral edema, mass, mass effect, or midline shift. No hydrocephalus or extra-axial collection. Vascular: No hyperdense vessel. Atherosclerotic calcifications in the intracranial carotid and vertebral arteries. Skull: Negative for fracture or focal lesion. Sinuses/Orbits: Mucosal thickening in the right maxillary sinus, bilateral sphenoid sinuses, and ethmoid air cells. Status post bilateral lens replacements. Other: The mastoid air cells are well aerated. ASPECTS [facility] Stroke Program Early CT Score) - Ganglionic level infarction (caudate, lentiform nuclei, internal capsule, insula, M1-M3 cortex): 7 - Supraganglionic infarction (M4-M6 cortex): 3 Total score (0-10 with 10 being normal): 10 IMPRESSION: 1. No acute intracranial process. 2. ASPECTS is 10. Code stroke imaging results were communicated on 02/27/2022 at 6:28 pm to provider BHAGAT via secure text paging. Electronically Signed   By: Merilyn Baba M.D.   On: 02/27/2022 18:29    EKG: Independently reviewed.  Sinus rhythm at 89 bpm.  Baseline artifact multiple leads.  Assessment/Plan Principal Problem:   Focal neurological  deficit Active Problems:   History of CVA (cerebrovascular accident)   Parkinson's disease   Hyperlipidemia   CAD (coronary artery disease)   DM (diabetes mellitus) (HCC)   GERD (gastroesophageal reflux disease)   Anxiety   Dementia due to Parkinson's disease without behavioral disturbance ([place])   Essential (primary)  hypertension   Obstructive sleep apnea   Dysphagia, unspecified   Posttraumatic stress disorder   History of CVA Focal neurologic deficit > Patient arrived as a code stroke after episode of focal neurologic deficit with facial droop and left-sided gaze presents earlier today.  Has also been having episodes of shaking and confusion that last 30 to 60 seconds but today lasted 10 minutes. > Lab work-up in the ED largely unremarkable.  Did have A1c of 6.2.  Imaging notable for CT of the head and neck with moderate to severe stenosis at the left supra clinoid ICA and moderate on the right at same location. > Evaluated by neurology in the ED and suspect dementia fluctuations versus stroke/TIA versus seizure.  Candidate for MRI due to his deep brain stimulator. - Monitor on telemetry - Appreciate neurology recommendations - Echocardiogram - We will hold home blood pressure medicines for now in case this is related to CVA/TIA - Repeat CT head at 6 AM - EEG - Hold off on PT/OT/SLP per neurology - Lipid panel  Dementia Parkinson's disease > Status post deep brain stimulator. - Continue home carbidopa-levodopa - Continue home rivastigmine - Continue home mirtazapine - Continue home Paxil - Continue home ropinirole  Anxiety PTSD - Continue home Paxil and mirtazapine  Hyperlipidemia - Not currently on an antilipid  CAD - Holding home lisinopril as above  Hypertension - Holding home amlodipine and lisinopril as above  Diabetes - SSI  GERD - Continue home PPI  DVT prophylaxis: Lovenox Code Status:   DNR Family Communication:  Updated at bedside Disposition  Plan:   Patient is from:  Home  Anticipated DC to:  Home  Anticipated DC date:  1 to 2 days  Anticipated DC barriers: None  Consults called:  Neurology, consulted in the ED, are following Admission status:  Observation, telemetry  Severity of Illness: The appropriate patient status for this patient is OBSERVATION. Observation status is judged to be reasonable and necessary in order to provide the required intensity of service to ensure the patient's safety. The patient's presenting symptoms, physical exam findings, and initial radiographic and laboratory data in the context of their medical condition is felt to place them at decreased risk for further clinical deterioration. Furthermore, it is anticipated that the patient will be medically stable for discharge from the hospital within 2 midnights of admission.    Marcelyn Bruins MD [facility]  How to contact the [facility] Attending or Consulting provider [place] or covering provider during after hours [place], for this patient?   Check the care team in [facility] and look for a) attending/consulting [facility] provider listed and b) the [facility] team listed Log into [email or web address] and use [place]'s universal password to access. If you do not have the password, please contact the hospital operator. Locate the [facility] provider you are looking for under [facility] and page to a number that you can be directly reached. If you still have difficulty reaching the provider, please page the [facility] (Director on Call) for the Hospitalists listed on amion for assistance.  02/28/2022, 12:11 AM

## 2022-02-28 NOTE — Progress Notes (Addendum)
Neurology Progress Note  Patient ID: Kirk Rosario is a 81 y.o. with PMHx of  has a past medical history of Anxiety, CAD (coronary artery disease) (06/25/2011), Chest pain at rest (06/07/2011), CVA (cerebral infarction) (06/24/2011), Diabetes mellitus, DM (diabetes mellitus) ([place]) (06/07/2011), Erosive esophagitis, Headache(784.0), Hernia, hiatal, Hiatal hernia, Hypercholesteremia, Internal hemorrhoids, Parkinson disease ([place]), S/P deep brain stimulator placement, Schatzki's ring, Stroke ([place]), and Tubular adenoma of colon (12/2008).   Subjective: Daughter at bedside provides additional clarifying history that this was one of the patient's typical episodes of altered awareness that their outpatient neurologist has said is related to his dementia but perhaps it was just lasting longer.  She queries whether this may be a panic attack as well and notes that when her father gets anxious her mother starts to get anxious as well They additionally note they have met with palliative care on an outpatient basis but did not find this helpful as they feel nothing was done and they received a bill Daughter and wife at bedside are unclear where the report of left-sided weakness came from and denies any left-sided weakness again to me today  Exam: Vitals:   02/28/22 0802 02/28/22 0830  BP: (!) 144/75 136/66  Pulse: 73 71  Resp: 20 16  Temp:    SpO2: 97% 97%   Gen: In bed, comfortable  Resp: non-labored breathing, no grossly audible wheezing Cardiac: Perfusing extremities well  Abd: soft, nt  Neuro: MS: Sleeping during echo examination.  Later is alert, interactive, oriented to age, month, situation and place. CN: Conservation officer, nature, masked facies, tongue midline, hearing intact to voice Motor: Improved movement today compared to examination yesterday.  Antigravity with both legs and able to maintain them antigravity for 5 seconds.  No pronator drift in bilateral upper extremities Sensory: Reports equal  sensation in all 4 extremities to light touch   Pertinent Data: Lab Results  Component Value Date   CHOL 99 02/28/2022   HDL 35 (L) 02/28/2022   LDLCALC 50 02/28/2022   TRIG 69 02/28/2022   CHOLHDL 2.8 02/28/2022   Lab Results  Component Value Date   HGBA1C 6.2 (H) 02/27/2022    Repeat Head CT personally reviewed, agree with radiology, no acute intracranial process (in the setting of limitations due to electrode artifact)  ECHO  1. Left ventricular ejection fraction, by estimation, is 60 to 65%. The  left ventricle has normal function. The left ventricle has no regional  wall motion abnormalities. There is mild concentric left ventricular  hypertrophy. Left ventricular diastolic  parameters are consistent with Grade I diastolic dysfunction (impaired  relaxation).   2. Right ventricular systolic function is normal. The right ventricular  size is normal.   3. Left atrial size was moderately dilated.   4. The mitral valve was not well visualized. No evidence of mitral valve  regurgitation.   5. The aortic valve is tricuspid. Aortic valve regurgitation is trivial.  No aortic stenosis is present.   6. The inferior vena cava is normal in size with greater than 50%  respiratory variability, suggesting right atrial pressure of 3 mmHg.   Last prior echocardiogram I can access is from 08/16/2017 at which time he had mild biatrial enlargement, but otherwise similar report  Impression: Extensive work-up is reassuring against dangerous etiology of these events, although with the left atrial enlargement he does have risk of paroxysmal atrial fibrillation.  I do favor that this is most likely of fluctuation related to his dementia, with the primary  alternative being symptomatic atrial fibrillation episodes.  Recommendations: -Consider event monitor on an outpatient basis -Continue outpatient neurology follow-up -Inpatient neurology will be available as needed going forward  Lesleigh Noe  MD-PhD [facility] [phone]   Greater than 50 minutes were spent in care of patient today, greater than 50% at bedside and in direct care coordination.  Discussed at length with daughter at bedside, (please note echocardiogram results were not discussed as they were not available at the time)

## 2022-02-28 NOTE — Progress Notes (Signed)
SLP Cancellation Note  Patient Details Name: Kirk Rosario MRN: [id number] DOB: Aug 09, 1940   Cancelled treatment:       Reason Eval/Treat Not Completed: Patient declined, no reason specified (Per radiology staff, pt's family has refused MBS stating that the pt just had one done. At time of the recommendation, the most recent MBS found in Care Everywhere was Sept, 2022, but the [facility] from February, 2023 was subsequently found. SLP is in agreement with deferring the MBS at this time and will follow pt to assess tolerance of the diet.)  Dulcinea Kinser I. Hardin Negus, [place], [place] Office number [phone]  Horton Marshall 02/28/2022, 12:43 PM

## 2022-02-28 NOTE — Discharge Summary (Signed)
Physician Discharge Summary  Kirk Rosario NFA:[id number] DOB: [birth year] DOA: 02/27/2022  PCP: Eldridge Abrahams, MD  Admit date: 02/27/2022 Discharge date: 02/28/2022  Admitted From: Home Disposition:  Home  Recommendations for Outpatient Follow-up:  Follow up with PCP in 1-2 weeks Please obtain BMP/CBC in one week your next doctors visit.  Outpatient follow up with Neurology Cardiology messaged to help arrange for outpatient Even monitor.    Discharge Condition: Stable CODE STATUS: DNR Diet recommendation: Cardiac  Brief/Interim Summary: 81 year old with history of HLD, CAD, CVA, Parkinson's, DM 2, GERD, anxiety, PTSD, HTN, OSA to the hospital with complaints of left-sided facial droop and left-sided gaze preference.  Patient has a spinal stimulator in place which is not compatible with MRI.  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  Neurology team consulted.  A1c noted to be 6.2, LDL 50.  Repeat CT head next morning negative for any acute pathology.  Eventually patient was cleared by neurology team for outpatient follow-up and event monitor.  Echocardiogram showed EF of 60%, grade 1 DD.  Speech and swallow recommended MBS patient and family refusing and would like to be discharged at this time. No further inpatient medical work-up indicated at this time.  Assessment and plan: TIA-initial CT head and repeat CT head the following day are both negative.  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  A1c 6.2, LDL 50.  Speech and swallow recommended dysphagia 1 diet followed by MBS.  Family refusing MBS at this time.  Would like to be discharged.  Seen by neurology who has cleared patient for discharge.  EEG shows mild to moderate diffuse encephalopathy but no seizures.  Echocardiogram shows EF 60%, grade 1 DD.   Diabetes mellitus type 2-we will discontinue  metformin as patient received contrast.  Continue sliding scale and Accu-Chek.  Glipizide 7.5 mg daily and 5 mg at bedtime.  While n.p.o. we will place him on D5 fluids   Hypokalemia/hypocalcemia-repletion   Essential hypertension-permissive hypertension   Dementia/Parkinson's-has a deep brain stimulator in place.  Continue home medications.   GERD-PPI   Anxiety/PTSD-Paxil and mirtazapine    Discharge Diagnoses:  Principal Problem:   Focal neurological deficit Active Problems:   History of CVA (cerebrovascular accident)   Parkinson's disease   Hyperlipidemia   CAD (coronary artery disease)   DM (diabetes mellitus) ([place])   GERD (gastroesophageal reflux disease)   Anxiety   Dementia due to Parkinson's disease without behavioral disturbance ([place])   Essential (primary) hypertension   Dysphagia, unspecified   Posttraumatic stress disorder      Consultations: Neurology  Subjective: No complaints wanting to go home  Discharge Exam: Vitals:   02/28/22 1220 02/28/22 1235  BP:  (!) 161/73  Pulse:  81  Resp:  17  Temp: 98.3 F (36.8 C)   SpO2:  97%   Vitals:   02/28/22 0802 02/28/22 0830 02/28/22 1220 02/28/22 1235  BP: (!) 144/75 136/66  (!) 161/73  Pulse: 73 71  81  Resp: '20 16  17  '$ Temp:   98.3 F (36.8 C)   TempSrc:   Oral   SpO2: 97% 97%  97%  Weight:        Discharge Instructions   Allergies as of 02/28/2022       Reactions   Trihexyphenidyl    Other reaction(s): Psychotic disorder  Medication List     TAKE these medications    Accu-Chek Comfort Curve test strip Generic drug: glucose blood   acetaminophen 325 MG tablet Commonly known as: TYLENOL Take 650 mg by mouth daily.   amLODipine 10 MG tablet Commonly known as: NORVASC Take 10 mg by mouth daily.   aspirin EC 325 MG tablet Take 325 mg by mouth daily as needed. For pain   carbidopa-levodopa 25-100 MG tablet Commonly known as: SINEMET IR Take 1-2 tablets by mouth See  admin instructions. Take 1 tablet 4 times a day then take 2 tablets at bedtime   carboxymethylcellulose 0.5 % Soln Commonly known as: REFRESH PLUS Place 1 drop into both eyes 4 (four) times daily.   glipiZIDE 5 MG tablet Commonly known as: GLUCOTROL Take 5-7.5 mg by mouth See admin instructions. Take 7.5 mg (1 and 1/2 tablet) by mouth in the morning, and 5 mg (1 tablet) at dinner.   lisinopril 40 MG tablet Commonly known as: ZESTRIL Take 40 mg by mouth daily.   metFORMIN 1000 MG tablet Commonly known as: GLUCOPHAGE Take 1,000 mg by mouth 2 (two) times daily with a meal.   mirtazapine 15 MG tablet Commonly known as: REMERON Take 15 mg by mouth at bedtime.   omeprazole 20 MG capsule Commonly known as: PriLOSEC Take 1 capsule (20 mg total) by mouth 2 (two) times daily. What changed: when to take this   PARoxetine 10 MG tablet Commonly known as: PAXIL Take 10 mg by mouth daily.   rivastigmine 9.5 mg/24hr Commonly known as: EXELON Place 9.5 mg onto the skin daily.   rOPINIRole 2 MG tablet Commonly known as: REQUIP Take 1 mg by mouth 3 (three) times daily.        Allergies  Allergen Reactions   Trihexyphenidyl     Other reaction(s): Psychotic disorder    You were cared for by a hospitalist during your hospital stay. If you have any questions about your discharge medications or the care you received while you were in the hospital after you are discharged, you can call the unit and asked to speak with the hospitalist on call if the hospitalist that took care of you is not available. Once you are discharged, your primary care physician will handle any further medical issues. Please note that no refills for any discharge medications will be authorized once you are discharged, as it is imperative that you return to your primary care physician (or establish a relationship with a primary care physician if you do not have one) for your aftercare needs so that they can reassess your  need for medications and monitor your lab values.   Procedures/Studies: ECHOCARDIOGRAM COMPLETE  Result Date: 02/28/2022    ECHOCARDIOGRAM REPORT   Patient Name:   Kirk Rosario Date of Exam: 02/28/2022 Medical Rec #:  [id number]        Height:       70.0 in Accession #:    [id number]       Weight:       223.5 lb Date of Birth:  06-Jul-1940        BSA:          2.188 m Patient Age:    30 years         BP:           136/66 mmHg Patient Gender: M                HR:  69 bpm. Exam Location:  Inpatient Procedure: 2D Echo, Cardiac Doppler, Color Doppler and Intracardiac            Opacification Agent Indications:    TIA G45.9  History:        Patient has no prior history of Echocardiogram examinations.                 CAD, CVA and TIA, Signs/Symptoms:Chest Pain; Risk                 Factors:Hypertension, Diabetes, Dyslipidemia and Sleep Apnea.  Sonographer:    Greer Pickerel Referring Phys: [id number] Candace Gallus MELVIN  Sonographer Comments: Technically difficult study due to poor echo windows, suboptimal parasternal window, suboptimal apical window and patient is obese. Image acquisition challenging due to respiratory motion. IMPRESSIONS  1. Left ventricular ejection fraction, by estimation, is 60 to 65%. The left ventricle has normal function. The left ventricle has no regional wall motion abnormalities. There is mild concentric left ventricular hypertrophy. Left ventricular diastolic parameters are consistent with Grade I diastolic dysfunction (impaired relaxation).  2. Right ventricular systolic function is normal. The right ventricular size is normal.  3. Left atrial size was moderately dilated.  4. The mitral valve was not well visualized. No evidence of mitral valve regurgitation.  5. The aortic valve is tricuspid. Aortic valve regurgitation is trivial. No aortic stenosis is present.  6. The inferior vena cava is normal in size with greater than 50% respiratory variability, suggesting right atrial  pressure of 3 mmHg. Comparison(s): No prior Echocardiogram. Conclusion(s)/Recommendation(s): Otherwise normal echocardiogram, with minor abnormalities described in the report. FINDINGS  Left Ventricle: Left ventricular ejection fraction, by estimation, is 60 to 65%. The left ventricle has normal function. The left ventricle has no regional wall motion abnormalities. Definity contrast agent was given IV to delineate the left ventricular  endocardial borders. The left ventricular internal cavity size was normal in size. There is mild concentric left ventricular hypertrophy. Left ventricular diastolic parameters are consistent with Grade I diastolic dysfunction (impaired relaxation). Right Ventricle: The right ventricular size is normal. No increase in right ventricular wall thickness. Right ventricular systolic function is normal. Left Atrium: Left atrial size was moderately dilated. Right Atrium: Right atrial size was normal in size. Prominent Eustachian valve. Pericardium: There is no evidence of pericardial effusion. Presence of epicardial fat layer. Mitral Valve: The mitral valve was not well visualized. No evidence of mitral valve regurgitation. Tricuspid Valve: The tricuspid valve is normal in structure. Tricuspid valve regurgitation is not demonstrated. No evidence of tricuspid stenosis. Aortic Valve: The aortic valve is tricuspid. Aortic valve regurgitation is trivial. No aortic stenosis is present. Pulmonic Valve: The pulmonic valve was normal in structure. Pulmonic valve regurgitation is trivial. No evidence of pulmonic stenosis. Aorta: The aortic root and ascending aorta are structurally normal, with no evidence of dilitation. Venous: The inferior vena cava is normal in size with greater than 50% respiratory variability, suggesting right atrial pressure of 3 mmHg. IAS/Shunts: No atrial level shunt detected by color flow Doppler.  LEFT VENTRICLE PLAX 2D LVIDd:         4.60 cm   Diastology LVIDs:         3.60  cm   LV e' medial:    8.49 cm/s LV PW:         1.80 cm   LV E/e' medial:  8.1 LV IVS:        1.05 cm   LV e' lateral:  13.10 cm/s LVOT diam:     2.00 cm   LV E/e' lateral: 5.2 LV SV:         76 LV SV Index:   35 LVOT Area:     3.14 cm  RIGHT VENTRICLE RV S prime:     13.70 cm/s TAPSE (M-mode): 2.8 cm LEFT ATRIUM              Index        RIGHT ATRIUM           Index LA diam:        3.10 cm  1.42 cm/m   RA Area:     18.40 cm LA Vol (A2C):   114.0 ml 52.09 ml/m  RA Volume:   40.80 ml  18.64 ml/m LA Vol (A4C):   80.1 ml  36.60 ml/m LA Biplane Vol: 99.9 ml  45.65 ml/m  AORTIC VALVE LVOT Vmax:   119.00 cm/s LVOT Vmean:  76.500 cm/s LVOT VTI:    0.241 m  AORTA Ao Root diam: 3.90 cm Ao Asc diam:  3.90 cm MITRAL VALVE               TRICUSPID VALVE MV Area (PHT): 2.74 cm    TR Peak grad:   33.2 mmHg MV Decel Time: 277 msec    TR Vmax:        288.00 cm/s MV E velocity: 68.70 cm/s MV A velocity: 88.30 cm/s  SHUNTS MV E/A ratio:  0.78        Systemic VTI:  0.24 m                            Systemic Diam: 2.00 cm Rudean Haskell MD Electronically signed by Rudean Haskell MD Signature Date/Time: 02/28/2022/11:17:02 AM    Final    EEG adult  Result Date: 02/28/2022 Lora Havens, MD     02/28/2022  8:44 AM Patient Name: Kirk Rosario MRN: [id number] Epilepsy Attending: Lora Havens Referring Physician/Provider: Rikki Spearing, NP Date: 02/27/2022 Duration: 23.33 mins Patient history: 82 year old male with altered mental status.  EEG to evaluate  for seizure. Level of alertness: Awake AEDs during EEG study: None Technical aspects: This EEG study was done with scalp electrodes positioned according to the 10-20 International system of electrode placement. Electrical activity was reviewed with band pass filter of 1-'70Hz'$ , sensitivity of 7 uV/mm, display speed of 60m/sec with a '60Hz'$  notched filter applied as appropriate. EEG data were recorded continuously and digitally stored.  Video monitoring was  available and reviewed as appropriate. Description: The posterior dominant rhythm consists of 7.5 Hz activity of moderate voltage (25-35 uV) seen predominantly in posterior head regions, symmetric and reactive to eye opening and eye closing. EEG showed continuous generalized 3 to 6 Hz theta-delta slowing. Physiologic photic driving was not seen during photic stimulation.  Hyperventilation was not performed.   ABNORMALITY - Continuous slow, generalized IMPRESSION: This study is suggestive of mild to moderate diffuse encephalopathy, nonspecific etiology. No seizures or epileptiform discharges were seen throughout the recording. [place]  CT HEAD WO CONTRAST (5MM)  Result Date: 02/28/2022 CLINICAL DATA:  81year old male with neurologic deficit. Code stroke presentation yesterday. EXAM: CT HEAD WITHOUT CONTRAST TECHNIQUE: Contiguous axial images were obtained from the base of the skull through the vertex without intravenous contrast. RADIATION DOSE REDUCTION: This exam was performed according to the departmental dose-optimization program which includes automated exposure control,  adjustment of the mA and/or kV according to patient size and/or use of iterative reconstruction technique. COMPARISON:  CT head and CTA head and neck yesterday. FINDINGS: Brain: Bilateral subthalamic deep brain electrodes are stable with associated streak artifact along their course. No midline shift, ventriculomegaly, mass effect, evidence of mass lesion, intracranial hemorrhage or evidence of cortically based acute infarction. Stable gray-white matter differentiation throughout the brain. Vascular: Calcified atherosclerosis at the skull base. No suspicious intracranial vascular hyperdensity. Skull: Stable vertex burr holes. No acute osseous abnormality identified. Sinuses/Orbits: Scattered paranasal sinus mucosal thickening is stable. No sinus fluid levels. Tympanic cavities and mastoids remain clear. Other: Stable scalp  electrodes. No acute orbit or scalp soft tissue finding. IMPRESSION: Stable non contrast CT appearance of the brain with bilateral subthalamic DBS. No acute or evolving infarct identified. Electronically Signed   By: Genevie Ann M.D.   On: 02/28/2022 07:15   DG Chest Portable 1 View  Result Date: 02/27/2022 CLINICAL DATA:  Altered mental status. Code stroke. Seizure-like activity. EXAM: PORTABLE CHEST 1 VIEW COMPARISON:  05/24/2018 FINDINGS: Generator pack over the right upper chest with lead tips extending into the neck. Shallow inspiration. Heart size and pulmonary vascularity are normal for technique. Probable linear atelectasis in the lung bases. No focal consolidation or edema. No pleural effusions. No pneumothorax. Mediastinal contours appear intact. IMPRESSION: Shallow inspiration with mild linear atelectasis in the lung bases. No focal consolidation. Electronically Signed   By: Lucienne Capers M.D.   On: 02/27/2022 20:01   CT ANGIO HEAD NECK W WO CM (CODE STROKE)  Result Date: 02/27/2022 CLINICAL DATA:  Stroke suspected EXAM: CT ANGIOGRAPHY HEAD AND NECK TECHNIQUE: Multidetector CT imaging of the head and neck was performed using the standard protocol during bolus administration of intravenous contrast. Multiplanar CT image reconstructions and MIPs were obtained to evaluate the vascular anatomy. Carotid stenosis measurements (when applicable) are obtained utilizing NASCET criteria, using the distal internal carotid diameter as the denominator. RADIATION DOSE REDUCTION: This exam was performed according to the departmental dose-optimization program which includes automated exposure control, adjustment of the mA and/or kV according to patient size and/or use of iterative reconstruction technique. CONTRAST:  37m OMNIPAQUE IOHEXOL 350 MG/ML SOLN COMPARISON:  No prior CTA, correlation is made with CT head 02/27/2022 FINDINGS: CT HEAD FINDINGS For noncontrast findings, please see same day CT head. CTA NECK  FINDINGS Aortic arch: Standard branching. Imaged portion shows no evidence of aneurysm or dissection. No significant stenosis of the major arch vessel origins. Right carotid system: No evidence of dissection, occlusion, or hemodynamically significant stenosis (greater than 50%). Left carotid system: No evidence of dissection, occlusion, or hemodynamically significant stenosis (greater than 50%). Vertebral arteries: No evidence of dissection, occlusion, or hemodynamically significant stenosis (greater than 50%). Skeleton: Edentulous. No acute osseous abnormality. Degenerative changes in the cervical spine. Bulky anterior osteophytes C4-C6. Other neck: Deep brain stimulator leads are seen in the soft tissues of the right chest and neck. Upper chest: No focal pulmonary opacity or pleural effusion. Review of the MIP images confirms the above findings CTA HEAD FINDINGS Anterior circulation: Both internal carotid arteries are patent to the termini, with moderate to severe stenosis in the left supraclinoid ICA, moderate stenosis in the right supraclinoid ICA, and mild stenosis in the right cavernous ICA. A1 segments patent. Normal anterior communicating artery. Anterior cerebral arteries are patent to their distal aspects. No M1 stenosis or occlusion. MCA branches perfused and symmetric. Posterior circulation: Vertebral arteries patent to the vertebrobasilar junction  without stenosis. Posterior inferior cerebellar arteries patent proximally. Basilar patent to its distal aspect. Superior cerebellar arteries patent proximally. Patent P1 segments. PCAs perfused to their distal aspects without stenosis. The bilateral posterior communicating arteries are not visualized. Venous sinuses: As permitted by contrast timing, patent. Anatomic variants: None significant. Review of the MIP images confirms the above findings IMPRESSION: 1. No intracranial large vessel occlusion. Moderate to severe stenosis in the left supraclinoid ICA,  moderate stenosis in the right supraclinoid ICA, and mild stenosis in the right cavernous ICA. 2. No hemodynamically significant stenosis in the neck. Electronically Signed   By: Merilyn Baba M.D.   On: 02/27/2022 18:54   CT HEAD CODE STROKE WO CONTRAST  Result Date: 02/27/2022 CLINICAL DATA:  Code stroke. EXAM: CT HEAD WITHOUT CONTRAST TECHNIQUE: Contiguous axial images were obtained from the base of the skull through the vertex without intravenous contrast. RADIATION DOSE REDUCTION: This exam was performed according to the departmental dose-optimization program which includes automated exposure control, adjustment of the mA and/or kV according to patient size and/or use of iterative reconstruction technique. COMPARISON:  09/14/2015 FINDINGS: Brain: Redemonstrated deep brain stimulator leads. No evidence of acute infarction, hemorrhage, cerebral edema, mass, mass effect, or midline shift. No hydrocephalus or extra-axial collection. Vascular: No hyperdense vessel. Atherosclerotic calcifications in the intracranial carotid and vertebral arteries. Skull: Negative for fracture or focal lesion. Sinuses/Orbits: Mucosal thickening in the right maxillary sinus, bilateral sphenoid sinuses, and ethmoid air cells. Status post bilateral lens replacements. Other: The mastoid air cells are well aerated. ASPECTS [facility] Stroke Program Early CT Score) - Ganglionic level infarction (caudate, lentiform nuclei, internal capsule, insula, M1-M3 cortex): 7 - Supraganglionic infarction (M4-M6 cortex): 3 Total score (0-10 with 10 being normal): 10 IMPRESSION: 1. No acute intracranial process. 2. ASPECTS is 10. Code stroke imaging results were communicated on 02/27/2022 at 6:28 pm to provider BHAGAT via secure text paging. Electronically Signed   By: Merilyn Baba M.D.   On: 02/27/2022 18:29     The results of significant diagnostics from this hospitalization (including imaging, microbiology, ancillary and laboratory) are listed  below for reference.     Microbiology: Recent Results (from the past 240 hour(s))  Resp Panel by RT-PCR (Flu A&B, Covid) Anterior Nasal Swab     Status: None   Collection Time: 02/27/22  7:45 PM   Specimen: Anterior Nasal Swab  Result Value Ref Range Status   SARS Coronavirus 2 by RT PCR NEGATIVE NEGATIVE Final    Comment: (NOTE) SARS-CoV-2 target nucleic acids are NOT DETECTED.  The SARS-CoV-2 RNA is generally detectable in upper respiratory specimens during the acute phase of infection. The lowest concentration of SARS-CoV-2 viral copies this assay can detect is 138 copies/mL. A negative result does not preclude SARS-Cov-2 infection and should not be used as the sole basis for treatment or other patient management decisions. A negative result may occur with  improper specimen collection/handling, submission of specimen other than nasopharyngeal swab, presence of viral mutation(s) within the areas targeted by this assay, and inadequate number of viral copies(<138 copies/mL). A negative result must be combined with clinical observations, patient history, and epidemiological information. The expected result is Negative.  Fact Sheet for Patients:  EntrepreneurPulse.com.au  Fact Sheet for Healthcare Providers:  IncredibleEmployment.be  This test is no t yet approved or cleared by the [place] FDA and  has been authorized for detection and/or diagnosis of SARS-CoV-2 by FDA under an Emergency Use Authorization (EUA). This EUA will remain  in  effect (meaning this test can be used) for the duration of the COVID-19 declaration under Section 564(b)(1) of the Act, 21 U.S.C.section 360bbb-3(b)(1), unless the authorization is terminated  or revoked sooner.       Influenza A by PCR NEGATIVE NEGATIVE Final   Influenza B by PCR NEGATIVE NEGATIVE Final    Comment: (NOTE) The Xpert Xpress SARS-CoV-2/FLU/RSV plus assay is intended as an aid in the  diagnosis of influenza from Nasopharyngeal swab specimens and should not be used as a sole basis for treatment. Nasal washings and aspirates are unacceptable for Xpert Xpress SARS-CoV-2/FLU/RSV testing.  Fact Sheet for Patients: EntrepreneurPulse.com.au  Fact Sheet for Healthcare Providers: IncredibleEmployment.be  This test is not yet approved or cleared by the [place] FDA and has been authorized for detection and/or diagnosis of SARS-CoV-2 by FDA under an Emergency Use Authorization (EUA). This EUA will remain in effect (meaning this test can be used) for the duration of the COVID-19 declaration under Section 564(b)(1) of the Act, 21 U.S.C. section 360bbb-3(b)(1), unless the authorization is terminated or revoked.  Performed at [facility], [place] [address], [place], [place] [address]      Labs: BNP (last 3 results) No results for input(s): "BNP" in the last 8760 hours. Basic Metabolic Panel: Recent Labs  Lab 02/27/22 1745 02/27/22 2127 02/28/22 0316  NA 141  143 142 139  K 3.9  4.0 3.6 3.2*  CL 106  107  --  107  CO2 25  --  22  GLUCOSE 126*  121*  --  121*  BUN 16  17  --  15  CREATININE 0.83  0.70  --  0.77  CALCIUM 8.8*  --  8.2*   Liver Function Tests: Recent Labs  Lab 02/27/22 1745 02/28/22 0316  AST 17 13*  ALT 8 6  ALKPHOS 103 83  BILITOT 0.3 0.5  PROT 6.7 5.7*  ALBUMIN 3.8 3.2*   No results for input(s): "LIPASE", "AMYLASE" in the last 168 hours. Recent Labs  Lab 02/27/22 1932  AMMONIA 31   CBC: Recent Labs  Lab 02/27/22 1745 02/27/22 2127 02/28/22 0316  WBC 9.0  --  7.4  NEUTROABS 5.9  --   --   HGB 14.6  14.3 13.6 12.9*  HCT 43.1  42.0 40.0 38.7*  MCV 87.4  --  89.0  PLT 235  --  235   Cardiac Enzymes: No results for input(s): "CKTOTAL", "CKMB", "CKMBINDEX", "TROPONINI" in the last 168 hours. BNP: Invalid input(s): "POCBNP" CBG: Recent Labs  Lab 02/27/22 1739  02/28/22 0802  GLUCAP 119* 119*   D-Dimer Recent Labs    02/27/22 1932  DDIMER 0.40   Hgb A1c Recent Labs    02/27/22 2119  HGBA1C 6.2*   Lipid Profile Recent Labs    02/28/22 0316  CHOL 99  HDL 35*  LDLCALC 50  TRIG 69  CHOLHDL 2.8   Thyroid function studies No results for input(s): "TSH", "T4TOTAL", "T3FREE", "THYROIDAB" in the last 72 hours.  Invalid input(s): "FREET3" Anemia work up No results for input(s): "VITAMINB12", "FOLATE", "FERRITIN", "TIBC", "IRON", "RETICCTPCT" in the last 72 hours. Urinalysis    Component Value Date/Time   COLORURINE YELLOW 02/27/2022 1923   APPEARANCEUR CLEAR 02/27/2022 1923   LABSPEC 1.030 02/27/2022 1923   PHURINE 7.0 02/27/2022 1923   GLUCOSEU NEGATIVE 02/27/2022 1923   HGBUR SMALL (A) 02/27/2022 [place] NEGATIVE 02/27/2022 [place] 02/27/2022 [place] NEGATIVE 02/27/2022 1923  UROBILINOGEN 1.0 06/21/2011 2308   NITRITE NEGATIVE 02/27/2022 1923   LEUKOCYTESUR NEGATIVE 02/27/2022 1923   Sepsis Labs Recent Labs  Lab 02/27/22 1745 02/28/22 0316  WBC 9.0 7.4   Microbiology Recent Results (from the past 240 hour(s))  Resp Panel by RT-PCR (Flu A&B, Covid) Anterior Nasal Swab     Status: None   Collection Time: 02/27/22  7:45 PM   Specimen: Anterior Nasal Swab  Result Value Ref Range Status   SARS Coronavirus 2 by RT PCR NEGATIVE NEGATIVE Final    Comment: (NOTE) SARS-CoV-2 target nucleic acids are NOT DETECTED.  The SARS-CoV-2 RNA is generally detectable in upper respiratory specimens during the acute phase of infection. The lowest concentration of SARS-CoV-2 viral copies this assay can detect is 138 copies/mL. A negative result does not preclude SARS-Cov-2 infection and should not be used as the sole basis for treatment or other patient management decisions. A negative result may occur with  improper specimen collection/handling, submission of specimen other than nasopharyngeal  swab, presence of viral mutation(s) within the areas targeted by this assay, and inadequate number of viral copies(<138 copies/mL). A negative result must be combined with clinical observations, patient history, and epidemiological information. The expected result is Negative.  Fact Sheet for Patients:  EntrepreneurPulse.com.au  Fact Sheet for Healthcare Providers:  IncredibleEmployment.be  This test is no t yet approved or cleared by the [place] FDA and  has been authorized for detection and/or diagnosis of SARS-CoV-2 by FDA under an Emergency Use Authorization (EUA). This EUA will remain  in effect (meaning this test can be used) for the duration of the COVID-19 declaration under Section 564(b)(1) of the Act, 21 U.S.C.section 360bbb-3(b)(1), unless the authorization is terminated  or revoked sooner.       Influenza A by PCR NEGATIVE NEGATIVE Final   Influenza B by PCR NEGATIVE NEGATIVE Final    Comment: (NOTE) The Xpert Xpress SARS-CoV-2/FLU/RSV plus assay is intended as an aid in the diagnosis of influenza from Nasopharyngeal swab specimens and should not be used as a sole basis for treatment. Nasal washings and aspirates are unacceptable for Xpert Xpress SARS-CoV-2/FLU/RSV testing.  Fact Sheet for Patients: EntrepreneurPulse.com.au  Fact Sheet for Healthcare Providers: IncredibleEmployment.be  This test is not yet approved or cleared by the [place] FDA and has been authorized for detection and/or diagnosis of SARS-CoV-2 by FDA under an Emergency Use Authorization (EUA). This EUA will remain in effect (meaning this test can be used) for the duration of the COVID-19 declaration under Section 564(b)(1) of the Act, 21 U.S.C. section 360bbb-3(b)(1), unless the authorization is terminated or revoked.  Performed at [facility], [place] [address] [place], [place] [address]      Time  coordinating discharge:  I have spent 35 minutes face to face with the patient and on the ward discussing the patients care, assessment, plan and disposition with other care givers. >50% of the time was devoted counseling the patient about the risks and benefits of treatment/Discharge disposition and coordinating care.   SIGNED:   Damita Lack, MD  [facility] 02/28/2022, 12:47 PM   If 7PM-7AM, please contact night-coverage

## 2022-02-28 NOTE — Evaluation (Signed)
Clinical/Bedside Swallow Evaluation Patient Details  Name: Kirk Rosario MRN: [id number] Date of Birth: [birth year]  Today's Date: 02/28/2022 Time: SLP Start Time (ACUTE ONLY): 9562 SLP Stop Time (ACUTE ONLY): 1308 SLP Time Calculation (min) (ACUTE ONLY): 15 min  Past Medical History:  Past Medical History:  Diagnosis Date   Anxiety    CAD (coronary artery disease) 06/25/2011   Chest pain at rest 06/07/2011   CVA (cerebral infarction) 06/24/2011   Diabetes mellitus    DM (diabetes mellitus) ([place]) 06/07/2011   Erosive esophagitis    Headache(784.0)    Hernia, hiatal    Hiatal hernia    Hypercholesteremia    Internal hemorrhoids    Parkinson disease (HCC)    S/P deep brain stimulator placement    Schatzki's ring    Stroke ([place])    Tubular adenoma of colon 12/2008   Past Surgical History:  Past Surgical History:  Procedure Laterality Date   CARDIAC CATHETERIZATION  06/21/2011   DEEP BRAIN STIMULATOR PLACEMENT     EYE SURGERY     HAND SURGERY     LEFT HEART CATHETERIZATION WITH CORONARY ANGIOGRAM N/A 06/21/2011   Procedure: LEFT HEART CATHETERIZATION WITH CORONARY ANGIOGRAM;  Surgeon: Leonie Man, MD;  Location: [facility] CATH LAB;  Service: Cardiovascular;  Laterality: N/A;   UPPER GASTROINTESTINAL ENDOSCOPY     SQUAMOUS MUCOSA WITH DIFFUSE EPITHELIAL HYPERPLASIA, PARAKERATOSIS,   HPI:  Kirk Rosario is a 81 y.o. male who presented as a code stroke due to left-sided deficits with left-sided facial droop and left gaze preference. CT head 10/9: bilateral  subthalamic DBS; no acute changes. CXR 10/9: Shallow inspiration with mild linear atelectasis in the lung bases. No focal consolidation. EEG 10/10: mild to moderate diffuse encephalopathy. PMH: hyperlipidemia, CAD, CVA with chronic, dysphagia, Parkinson's, dementia, diabetes, GERD, anxiety, PTSD, hypertension, OSA. MBS at [facility] 01/27/21: Pharyngeal stage is primarily characterized by mistiming of the pharyngeal swallow, decreased bolus  propulsion, incomplete airway closure, and reduced hyolaryngeal elevation/excursion. Deficits resulted in mild post-swallow residual with thin and puree; severe post-swallow residual with solid that minimizes with liquid washes; penetration during the swallow across consistencies (aside from pill trial), and aspiration of thin liquid after the swallow on pyriform residual. Mechanical soft/nectar-thick liquids versus regular/thin with double swallow; Kirk Rosario opted for a trial of nectar thick liquids at that time.    Assessment / Plan / Recommendation  Clinical Impression  Kirk Rosario was seen for bedside swallow evaluation. Kirk Rosario was somewhat lethargic during the evaluation and his responses were often unrelated to questions. Kirk Rosario's wife was contacted via phone and she reported that the Kirk Rosario has been consuming softer foods and nectar thick liquids without signs of aspiration. She stated that she purees dual-consistency soups, but that no other solids require this. Kirk Rosario was unable to participate in a complete oral mechanism exam due to his difficulty following commands. Kirk Rosario was was edentulous and denied use of dentures. Kirk Rosario exhibited impaired mastication, prolonged bolus manipulation, and oral holding. Mastication was either prolonged without significant change in the size/nature of the boluses or very brief suggesting swallowing of whole boluses. No s/s of aspiration were noted immediately following trials, but significant coughing was noted once the evaluation was concluded and SLP was conversing with Kirk Rosario's RN outside of Kirk Rosario's room. Considering Kirk Rosario's history of pharyngeal dysphagia, SLP questions aspiration of pharyngeal residue. A dysphagia 1 diet with nectar thick liquids will be initiated at this time with observance of swallowing precautions. A modified barium swallow study is recommended  to further assess swallow physiology; it is scheduled for today at 1230. SLP Visit Diagnosis: Dysphagia, unspecified (R13.10)    Aspiration Risk        Diet Recommendation Dysphagia 1 (Puree);Nectar-thick liquid   Liquid Administration via: Cup;Straw Medication Administration: Crushed with puree Supervision: Staff to assist with self feeding Compensations: Slow rate;Small sips/bites;Minimize environmental distractions;Follow solids with liquid;Clear throat intermittently Postural Changes: Seated upright at 90 degrees    Other  Recommendations Oral Care Recommendations: Oral care BID    Recommendations for follow up therapy are one component of a multi-disciplinary discharge planning process, led by the attending physician.  Recommendations may be updated based on patient status, additional functional criteria and insurance authorization.  Follow up Recommendations  (TBD)      Assistance Recommended at Discharge Frequent or constant Supervision/Assistance  Functional Status Assessment Patient has had a recent decline in their functional status and demonstrates the ability to make significant improvements in function in a reasonable and predictable amount of time.  Frequency and Duration min 2x/week  2 weeks       Prognosis Prognosis for Safe Diet Advancement: Fair Barriers to Reach Goals: Cognitive deficits;Time post onset;Severity of deficits      Swallow Study   General Date of Onset: 02/27/22 HPI: Kirk Rosario is a 81 y.o. male who presented as a code stroke due to left-sided deficits with left-sided facial droop and left gaze preference. CT head 10/9: bilateral  subthalamic DBS; no acute changes. CXR 10/9: Shallow inspiration with mild linear atelectasis in the lung bases. No focal consolidation. EEG 10/10: mild to moderate diffuse encephalopathy. PMH: hyperlipidemia, CAD, CVA with chronic, dysphagia, Parkinson's, dementia, diabetes, GERD, anxiety, PTSD, hypertension, OSA. MBS at [facility] 01/27/21: Pharyngeal stage is primarily characterized by mistiming of the pharyngeal swallow, decreased bolus propulsion, incomplete airway  closure, and reduced hyolaryngeal elevation/excursion. Deficits resulted in mild post-swallow residual with thin and puree; severe post-swallow residual with solid that minimizes with liquid washes; penetration during the swallow across consistencies (aside from pill trial), and aspiration of thin liquid after the swallow on pyriform residual. Mechanical soft/nectar-thick liquids versus regular/thin with double swallow; Kirk Rosario opted for a trial of nectar thick liquids at that time. Type of Study: Bedside Swallow Evaluation Previous Swallow Assessment: See HPI Diet Prior to this Study: NPO Temperature Spikes Noted: No Respiratory Status: Room air History of Recent Intubation: No Behavior/Cognition: Confused;Lethargic/Drowsy;Requires cueing;Doesn't follow directions Oral Cavity Assessment: Dry;Dried secretions Oral Cavity - Dentition: Edentulous Self-Feeding Abilities: Total assist Patient Positioning: Upright in bed;Postural control adequate for testing Baseline Vocal Quality: Low vocal intensity Volitional Cough: Cognitively unable to elicit Volitional Swallow: Unable to elicit    Oral/Motor/Sensory Function Overall Oral Motor/Sensory Function:  (difficult to assess)   Ice Chips Ice chips: Not tested   Thin Liquid Thin Liquid: Not tested    Nectar Thick Nectar Thick Liquid: Within functional limits Presentation: Straw   Honey Thick Honey Thick Liquid: Not tested   Puree Puree: Impaired Presentation: Spoon Oral Phase Impairments: Poor awareness of bolus Oral Phase Functional Implications: Oral holding;Prolonged oral transit   Solid     Solid: Impaired Presentation: Spoon Oral Phase Impairments: Poor awareness of bolus;Impaired mastication Oral Phase Functional Implications: Impaired mastication;Prolonged oral transit;Oral holding     Daiden Coltrane I. Hardin Negus, [place], [place] Office number [phone]  Horton Marshall 02/28/2022,10:54 AM

## 2022-02-28 NOTE — Progress Notes (Signed)
81 year old with history of HLD, CAD, CVA, Parkinson's, DM 2, GERD, anxiety, PTSD, HTN, OSA to the hospital with complaints of left-sided facial droop and left-sided gaze preference.  Patient has a spinal stimulator in place which is not compatible with MRI.  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  Neurology team consulted.  A1c noted to be 6.2, LDL 50.  Repeat CT head next morning negative for any acute pathology. No acute events overnight.  Patient is drowsy evaluation, hard to get much history out of him. I called Horris Latino, but no answer. Vitals are overall stable   TIA-initial CT head and repeat CT head the following day are both negative.  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  A1c 6.2, LDL 50.  Neurology is following.  Due to shaking EEG ordered.  Speech and swallow recommending dysphagia 1 diet.  May require MBS.  Diabetes mellitus type 2-we will discontinue metformin as patient received contrast.  Continue sliding scale and Accu-Chek.  Glipizide 7.5 mg daily and 5 mg at bedtime.  While n.p.o. we will place him on D5 fluids  Hypokalemia/hypocalcemia-repletion  Essential hypertension-permissive hypertension  Dementia/Parkinson's-has a deep brain stimulator in place.  Continue home medications.  GERD-PPI  Anxiety/PTSD-Paxil and mirtazapine  DVT prophylaxis-Lovenox  Gerlean Ren MD [facility]

## 2022-02-28 NOTE — Procedures (Signed)
Patient Name: Kirk Rosario  MRN: [id number]  Epilepsy Attending: Lora Havens  Referring Physician/Provider: Rikki Spearing, NP Date: 02/27/2022 Duration: 23.33 mins  Patient history: 81 year old male with altered mental status.  EEG to evaluate  for seizure.  Level of alertness: Awake  AEDs during EEG study: None  Technical aspects: This EEG study was done with scalp electrodes positioned according to the 10-20 International system of electrode placement. Electrical activity was reviewed with band pass filter of 1-'70Hz'$ , sensitivity of 7 uV/mm, display speed of 59m/sec with a '60Hz'$  notched filter applied as appropriate. EEG data were recorded continuously and digitally stored.  Video monitoring was available and reviewed as appropriate.  Description: The posterior dominant rhythm consists of 7.5 Hz activity of moderate voltage (25-35 uV) seen predominantly in posterior head regions, symmetric and reactive to eye opening and eye closing. EEG showed continuous generalized 3 to 6 Hz theta-delta slowing. Physiologic photic driving was not seen during photic stimulation.  Hyperventilation was not performed.     ABNORMALITY - Continuous slow, generalized  IMPRESSION: This study is suggestive of mild to moderate diffuse encephalopathy, nonspecific etiology. No seizures or epileptiform discharges were seen throughout the recording.  [name] OBarbra Sarks

## 2022-03-01 ENCOUNTER — Encounter: Payer: Self-pay | Admitting: *Deleted

## 2022-03-01 ENCOUNTER — Other Ambulatory Visit: Payer: Self-pay | Admitting: Cardiology

## 2022-03-01 DIAGNOSIS — I639 Cerebral infarction, unspecified: Secondary | ICD-10-CM

## 2022-03-01 DIAGNOSIS — I517 Cardiomegaly: Secondary | ICD-10-CM

## 2022-03-01 DIAGNOSIS — I440 Atrioventricular block, first degree: Secondary | ICD-10-CM

## 2022-03-01 DIAGNOSIS — I480 Paroxysmal atrial fibrillation: Secondary | ICD-10-CM

## 2022-03-01 NOTE — Progress Notes (Signed)
Patient ID: Kirk Rosario, male   DOB: August 23, 1940, 81 y.o.   MRN: [id number] Patient enrolled for [employer] to ship a 30 day cardiac event monitor to his address on file.  Letter with instructions mailed to patient.

## 2022-04-03 ENCOUNTER — Ambulatory Visit: Payer: Medicare Other | Admitting: Cardiovascular Disease

## 2022-10-21 DEATH — deceased
